# Patient Record
Sex: Female | Born: 1990 | Race: Black or African American | Hispanic: No | Marital: Single | Smoking: Former smoker
Health system: Southern US, Community
[De-identification: ages and names within clinical notes are randomized; demographics above are authoritative.]

## PROBLEM LIST (undated history)

## (undated) ENCOUNTER — Inpatient Hospital Stay (HOSPITAL_COMMUNITY): Payer: Self-pay

## (undated) DIAGNOSIS — J302 Other seasonal allergic rhinitis: Secondary | ICD-10-CM

## (undated) DIAGNOSIS — Z789 Other specified health status: Secondary | ICD-10-CM

## (undated) HISTORY — DX: Other specified health status: Z78.9

## (undated) HISTORY — PX: NO PAST SURGERIES: SHX2092

---

## 2011-02-08 ENCOUNTER — Inpatient Hospital Stay (INDEPENDENT_AMBULATORY_CARE_PROVIDER_SITE_OTHER)
Admission: RE | Admit: 2011-02-08 | Discharge: 2011-02-08 | Disposition: A | Discharge status: Home / Self Care | Payer: Self-pay | Source: Ambulatory Visit | Attending: Family Medicine | Admitting: Family Medicine

## 2011-02-08 DIAGNOSIS — S139XXA Sprain of joints and ligaments of unspecified parts of neck, initial encounter: Secondary | ICD-10-CM

## 2011-06-03 ENCOUNTER — Emergency Department (HOSPITAL_COMMUNITY)
Admission: EM | Admit: 2011-06-03 | Discharge: 2011-06-04 | Disposition: A | Discharge status: Home / Self Care | Payer: Self-pay | Attending: Emergency Medicine | Admitting: Emergency Medicine

## 2011-06-03 ENCOUNTER — Encounter: Payer: Self-pay | Admitting: *Deleted

## 2011-06-03 DIAGNOSIS — A499 Bacterial infection, unspecified: Secondary | ICD-10-CM | POA: Insufficient documentation

## 2011-06-03 DIAGNOSIS — B9689 Other specified bacterial agents as the cause of diseases classified elsewhere: Secondary | ICD-10-CM | POA: Insufficient documentation

## 2011-06-03 DIAGNOSIS — N760 Acute vaginitis: Secondary | ICD-10-CM | POA: Insufficient documentation

## 2011-06-03 HISTORY — DX: Other seasonal allergic rhinitis: J30.2

## 2011-06-03 NOTE — ED Provider Notes (Signed)
History     CSN: [id number] Arrival date & time: 06/03/2011 11:14 PM   First MD Initiated Contact with Patient 06/03/11 2316      Chief Complaint  Patient presents with  . Vaginal Discharge    (Consider location/radiation/quality/duration/timing/severity/associated sxs/prior treatment) HPI The patient is a 20 year old G1 P0 010 who presents today complaining of one month of whitish and clear vaginal discharge. Patient had unprotected sex with a new partner 2 months ago. She says is discharged in bothering her just over the past month. Last period was November 1. She do not suspect that she could be pregnant. She denies any abdominal pain, nausea, vomiting, fevers, or other symptoms. She denies any dysuria or increased urinary frequency. Patient does not have any history of STDs or bacterial vaginosis.  She does notice she has been trying to douche and that this seems to have made things worse. There are no other associated or modifying factors.  Past Medical History  Diagnosis Date  . Seasonal allergies     History reviewed. No pertinent past surgical history.  Family History  Problem Relation Age of Onset  . Hypertension Mother     History  Substance Use Topics  . Smoking status: Former Games developer  . Smokeless tobacco: Former Neurosurgeon    Quit date: 03/03/2011  . Alcohol Use: No    OB History    Grav Para Term Preterm Abortions TAB SAB Ect Mult Living                  Review of Systems  Constitutional: Negative.   HENT: Negative.   Eyes: Negative.   Respiratory: Negative.   Cardiovascular: Negative.   Gastrointestinal: Negative.   Genitourinary: Positive for vaginal discharge.  Musculoskeletal: Negative.   Skin: Negative.   Neurological: Negative.   Hematological: Negative.   Psychiatric/Behavioral: Negative.   All other systems reviewed and are negative.    Allergies  Advil  Home Medications   Current Outpatient Rx  Name Route Sig Dispense Refill  . THERA M  PLUS PO TABS Oral Take 1 tablet by mouth daily.      Marland Kitchen OVER THE COUNTER MEDICATION Oral Take 1 tablet by mouth daily as needed. For allergies "[facility] 4 hour allergy medicine"     . LORATADINE 10 MG PO TABS Oral Take 10 mg by mouth daily.        BP 114/70  Pulse 79  Temp(Src) 98 F (36.7 C) (Oral)  Resp 18  SpO2 98%  LMP 05/26/2011  Physical Exam  Nursing note and vitals reviewed. Constitutional: She is oriented to person, place, and time. She appears well-developed and well-nourished. No distress.  HENT:  Head: Normocephalic and atraumatic.  Eyes: Conjunctivae and EOM are normal. Pupils are equal, round, and reactive to light.  Neck: Normal range of motion.  Cardiovascular: Normal rate, regular rhythm, normal heart sounds and intact distal pulses.  Exam reveals no gallop and no friction rub.   No murmur heard. Pulmonary/Chest: Effort normal and breath sounds normal. No respiratory distress. She has no wheezes. She has no rales.  Abdominal: Soft. Bowel sounds are normal. She exhibits no distension. There is no tenderness. There is no rebound and no guarding.  Genitourinary: Uterus normal. Pelvic exam was performed with patient prone. Cervix exhibits discharge. Cervix exhibits no motion tenderness and no friability. Right adnexum displays no tenderness. Left adnexum displays no tenderness. No tenderness or bleeding around the vagina. Vaginal discharge found.  Small amount of thin whitish discharge noted in the vaginal vault  Musculoskeletal: Normal range of motion.  Neurological: She is alert and oriented to person, place, and time. No cranial nerve deficit. She exhibits normal muscle tone. Coordination normal.  Skin: Skin is warm and dry. No rash noted.  Psychiatric: She has a normal mood and affect.    ED Course  Procedures (including critical care time)  Labs Reviewed  URINALYSIS, ROUTINE W REFLEX MICROSCOPIC - Abnormal; Notable for the following:    Appearance  CLOUDY (*)    Bilirubin Urine SMALL (*)    Ketones, ur 15 (*)    All other components within normal limits  WET PREP, GENITAL - Abnormal; Notable for the following:    Clue Cells, Wet Prep FEW (*)    WBC, Wet Prep HPF POC FEW (*)    All other components within normal limits  POCT PREGNANCY, URINE  GC/CHLAMYDIA PROBE AMP, GENITAL   No results found.   1. Bacterial vaginosis       MDM  The patient was evaluated by myself. She was nontoxic in appearance and had complaint of vaginal discharge. Urinalysis and urine pregnancy were ordered. Patient will also have a pelvic exam with wet prep and gonorrhea and Chlamydia testing. She did have concern for possible sexual transmitted disease given recent objective defects within his partner. Pelvic exam was performed and was remarkable only for a small amount of discharge. Patient did not have presentation consistent with cervicitis or pelvic inflammatory disease. Patient did have wet prep with evidence of clue cells. She was given Flagyl 500 mg by mouth and will be discharged with a prescription for this for the next 7 days. Patient was discharged home in good condition.       Cyndra Numbers, MD 06/04/11 [phone]

## 2011-06-03 NOTE — ED Notes (Signed)
C/o vaginal d/c, onset ~ 1 month ago, also itching after douche ~ 1-2 wks ago. (Denies: uti  Sx, pain, nvd or fever).

## 2011-06-04 LAB — URINALYSIS, ROUTINE W REFLEX MICROSCOPIC
Glucose, UA: NEGATIVE mg/dL
Hgb urine dipstick: NEGATIVE
Specific Gravity, Urine: 1.028 (ref 1.005–1.030)
pH: 6 (ref 5.0–8.0)

## 2011-06-04 LAB — WET PREP, GENITAL
Trich, Wet Prep: NONE SEEN
Yeast Wet Prep HPF POC: NONE SEEN

## 2011-06-04 MED ORDER — METRONIDAZOLE 500 MG PO TABS: 500.0000 mg | ORAL_TABLET | Freq: Two times a day (BID) | ORAL | Status: AC

## 2011-06-04 MED ORDER — METRONIDAZOLE 500 MG PO TABS
500.0000 mg | ORAL_TABLET | Freq: Once | ORAL | Status: AC
  Administered 2011-06-04: 500 mg via ORAL
  Filled 2011-06-04: qty 1

## 2011-06-04 NOTE — ED Notes (Signed)
Pt states that she has had vaginal discharge for the past 2 months. She stands and she feels that her discharge runs out a fair amount of discharge with a foul smell. Pt denies new sexual partner. Pt alert and oriented.

## 2011-06-04 NOTE — ED Notes (Signed)
Pt told to follow up with pcp and return for new or worse syptoms. Pt told to update phone number for phone calls. Pt told to take all the rx medication.

## 2011-06-06 LAB — GC/CHLAMYDIA PROBE AMP, GENITAL: GC Probe Amp, Genital: NEGATIVE

## 2012-04-27 ENCOUNTER — Encounter (HOSPITAL_COMMUNITY): Payer: Self-pay | Admitting: Emergency Medicine

## 2012-04-27 ENCOUNTER — Emergency Department (INDEPENDENT_AMBULATORY_CARE_PROVIDER_SITE_OTHER)
Admission: EM | Admit: 2012-04-27 | Discharge: 2012-04-27 | Disposition: A | Discharge status: Home / Self Care | Payer: Self-pay | Source: Home / Self Care | Attending: Family Medicine | Admitting: Family Medicine

## 2012-04-27 DIAGNOSIS — N898 Other specified noninflammatory disorders of vagina: Secondary | ICD-10-CM

## 2012-04-27 DIAGNOSIS — N949 Unspecified condition associated with female genital organs and menstrual cycle: Secondary | ICD-10-CM

## 2012-04-27 DIAGNOSIS — Z3201 Encounter for pregnancy test, result positive: Secondary | ICD-10-CM

## 2012-04-27 DIAGNOSIS — O26899 Other specified pregnancy related conditions, unspecified trimester: Secondary | ICD-10-CM

## 2012-04-27 DIAGNOSIS — R102 Pelvic and perineal pain: Secondary | ICD-10-CM

## 2012-04-27 LAB — WET PREP, GENITAL: Yeast Wet Prep HPF POC: NONE SEEN

## 2012-04-27 LAB — POCT URINALYSIS DIP (DEVICE)
Bilirubin Urine: NEGATIVE
Hgb urine dipstick: NEGATIVE
Nitrite: NEGATIVE
Protein, ur: NEGATIVE mg/dL
pH: 7 (ref 5.0–8.0)

## 2012-04-27 NOTE — ED Notes (Signed)
Pt c/o lower abd pain and lower back pain x 1 month. Has had positive home pregnancy tests x 2. LMP July.   Also states she is having white vaginal discharge, no odor.

## 2012-04-27 NOTE — ED Provider Notes (Signed)
History     CSN: [id number]  Arrival date & time 04/27/12  1535   First MD Initiated Contact with Patient 04/27/12 1552      Chief Complaint  Patient presents with  . Back Pain  . Abdominal Pain    (Consider location/radiation/quality/duration/timing/severity/associated sxs/prior treatment) HPI Comments: 21 year old female in G2 P0 10 here complaining of low abdominal discomfort (described as intermittent cramping) and intermittently low back pain for one month. Symptoms associated with intermittent nausea and repulsion to some foods. Has had 2 positive urine pregnancy test at home. States her last menstrual period was July 9 it only lasted 4 days (patient usually menstruates for 7 days) Also concerned about a white thin vaginal discharge. Denies vaginal oddor, itchiness or burning. Denies prior history of sexually transmitted diseases. She has had bacterial vaginosis in the past. Denies pelvic pain here. Denies vaginal bleeding or spotting. No headaches or dizziness. Denies dysuria or hematuria. Denies fever or chills. States her nose has been congested and she has a history of seasonal allergies and takes Claritin intermittently for her symptoms. She is a former smoker that quit smoking over a year ago. She is happy about her positive urine pregnancy results.   Past Medical History  Diagnosis Date  . Seasonal allergies     History reviewed. No pertinent past surgical history.  Family History  Problem Relation Age of Onset  . Hypertension Mother     History  Substance Use Topics  . Smoking status: Former Games developer  . Smokeless tobacco: Former Neurosurgeon    Quit date: 03/03/2011  . Alcohol Use: No    OB History    Grav Para Term Preterm Abortions TAB SAB Ect Mult Living                  Review of Systems  Constitutional: Positive for appetite change. Negative for fever and chills.  HENT: Positive for congestion.   Respiratory: Negative for shortness of breath and wheezing.     Gastrointestinal: Positive for nausea and abdominal pain. Negative for vomiting and diarrhea.  Genitourinary: Negative for dysuria, flank pain and dyspareunia.  Musculoskeletal: Positive for back pain.  Neurological: Negative for dizziness, weakness, numbness and headaches.    Allergies  Ibuprofen  Home Medications   Current Outpatient Rx  Name Route Sig Dispense Refill  . LORATADINE 10 MG PO TABS Oral Take 10 mg by mouth daily.      Carma Leaven M PLUS PO TABS Oral Take 1 tablet by mouth daily.      Marland Kitchen OVER THE COUNTER MEDICATION Oral Take 1 tablet by mouth daily as needed. For allergies "[facility] 4 hour allergy medicine"       BP 115/59  Pulse 81  Temp 98.4 F (36.9 C) (Oral)  Resp 18  Ht 5' 2.5" (1.588 m)  Wt 164 lb (74.39 kg)  BMI 29.52 kg/m2  SpO2 100%  LMP 01/31/2012  Physical Exam  Nursing note and vitals reviewed. Constitutional: She is oriented to person, place, and time. She appears well-developed and well-nourished. No distress.  HENT:  Head: Normocephalic and atraumatic.  Mouth/Throat: Oropharynx is clear and moist. No oropharyngeal exudate.       Nasal congestion with mild swelling and erythema of nasal turbinates and clear rhinorrhea. Normal pharynx. No exudates.  Eyes: Conjunctivae normal are normal. Right eye exhibits no discharge. Left eye exhibits no discharge. No scleral icterus.  Neck: Neck supple.  Cardiovascular: Normal rate, regular rhythm and normal heart sounds.  Pulmonary/Chest: Effort normal and breath sounds normal. She has no wheezes. She has no rales.  Abdominal: Soft. Bowel sounds are normal. She exhibits no distension and no mass. There is no tenderness. There is no rebound and no guarding.       No costovertebral tenderness  Genitourinary:    There is no rash or lesion on the right labia. There is no rash or lesion on the left labia. Uterus is enlarged. Uterus is not tender. Cervix exhibits no motion tenderness, no discharge and no  friability. Right adnexum displays no mass, no tenderness and no fullness. Left adnexum displays no mass, no tenderness and no fullness. No erythema or bleeding around the vagina. Vaginal discharge found.    Musculoskeletal:       No pain with palpation over spine bone processes. No obvious scoliosis or lordosis. And Reported tenderness to palpation in impress increased  muscle tone of the bilateral lumbar paravertebral muscles. Negative straight leg test bilaterally.   Lymphadenopathy:    She has no cervical adenopathy.  Neurological: She is alert and oriented to person, place, and time.  Skin: No rash noted. She is not diaphoretic.    ED Course  Procedures (including critical care time)  Labs Reviewed  POCT URINALYSIS DIP (DEVICE) - Abnormal; Notable for the following:    Leukocytes, UA TRACE (*)  Biochemical Testing Only. Please order routine urinalysis from main lab if confirmatory testing is needed.   All other components within normal limits  POCT PREGNANCY, URINE - Abnormal; Notable for the following:    Preg Test, Ur POSITIVE (*)     All other components within normal limits  URINE CULTURE  GC/CHLAMYDIA PROBE AMP, GENITAL  WET PREP, GENITAL   No results found.   1. Pregnancy test-positive   2. Vaginal discharge in pregnancy   3. Pelvic pain in pregnancy       MDM  21 year old G2 P0 here complaining of intermittent pelvic cramping, low back pain and vaginal discharge. Positive urine pregnancy test and trace leukocyte esterase. No dysuria. Pending tests include wet prep, GC and Chlamydia and urine culture. We'll call patient about lab results and we'll call in any medication to her pharmacy for treatment if needed. Other than vaginal discharge normal pelvic examination with no vaginal bleeding and no tenderness to cervical motion or with palpation of the uterus. Adnexa impress not enlarged. Despite reassuring clinical exam patient was asked to go to [facility] hospital as  per protocol for pregnant women complaining of abdominal pain she would likely have an obstetric ultrasound. Patient was encouraged to start prenatal care.  Sharin Grave, MD 05/01/12 1125

## 2012-04-28 LAB — URINE CULTURE: Culture: NO GROWTH

## 2012-04-30 ENCOUNTER — Telehealth (HOSPITAL_COMMUNITY): Payer: Self-pay | Admitting: *Deleted

## 2012-04-30 MED ORDER — METRONIDAZOLE 500 MG PO TABS: 500.0000 mg | ORAL_TABLET | Freq: Two times a day (BID) | ORAL | Status: DC

## 2012-04-30 NOTE — Progress Notes (Signed)
Bacterial Vaginosis in pregnancy.  Medication (Flagyl) sent to preferred pharmacy for vaginal irritation and clue cells found on wet prep performed on 04/29/12.  Attempt to call pt 04/30/12 @9 :22pm unsuccessful, not able to leave voicemail.

## 2012-04-30 NOTE — ED Notes (Signed)
GC/Chlamydia neg., Wet prep: many clue cells.  Lab shown to Lannie Fields NP.  She e-prescribed Flagyl to the [facility] on [address].  She tried to call pt. but could not reach her. Call 1. I called later and message said VM is not set up.  Call 2. Vassie Moselle 04/30/2012

## 2012-05-01 ENCOUNTER — Telehealth (HOSPITAL_COMMUNITY): Payer: Self-pay | Admitting: *Deleted

## 2012-05-01 NOTE — ED Notes (Signed)
Pt. Called back on VM yesterday @ 2257.  I called pt. Pt. verified x 2 and given results. Pt. told she needs Flagyl and it was E[facility]prescribed to the [facility].  She said that is far away and prefers [facility]. I told her to go to [facility] and get it transferred.  She can change her preferred pharmacy the next time she is here. Pt. instructed to no alcohol while taking this medication.  Pt. voiced understanding.

## 2012-05-05 NOTE — Progress Notes (Signed)
Medical screening examination/treatment/procedure(s) were performed by resident physician or non-physician practitioner and as supervising physician I was immediately available for consultation/collaboration.   KINDL,JAMES DOUGLAS MD.  

## 2012-07-10 ENCOUNTER — Emergency Department (INDEPENDENT_AMBULATORY_CARE_PROVIDER_SITE_OTHER)
Admission: EM | Admit: 2012-07-10 | Discharge: 2012-07-10 | Disposition: A | Discharge status: Home / Self Care | Payer: Medicaid Other | Source: Home / Self Care | Attending: Emergency Medicine | Admitting: Emergency Medicine

## 2012-07-10 ENCOUNTER — Encounter (HOSPITAL_COMMUNITY): Payer: Self-pay | Admitting: Emergency Medicine

## 2012-07-10 DIAGNOSIS — O26899 Other specified pregnancy related conditions, unspecified trimester: Secondary | ICD-10-CM

## 2012-07-10 DIAGNOSIS — R040 Epistaxis: Secondary | ICD-10-CM

## 2012-07-10 LAB — POCT URINALYSIS DIP (DEVICE)
Bilirubin Urine: NEGATIVE
Glucose, UA: NEGATIVE mg/dL
Hgb urine dipstick: NEGATIVE
Ketones, ur: NEGATIVE mg/dL
Leukocytes, UA: NEGATIVE
Nitrite: NEGATIVE
Protein, ur: NEGATIVE mg/dL
Specific Gravity, Urine: 1.01 (ref 1.005–1.030)
Urobilinogen, UA: 0.2 mg/dL (ref 0.0–1.0)
pH: 7 (ref 5.0–8.0)

## 2012-07-10 NOTE — ED Provider Notes (Signed)
Chief Complaint  Patient presents with  . Abdominal Pain    History of Present Illness:    The patient is a 21 year old female who is 5 months pregnant. Her last menstrual period was July 9. This is her first pregnancy. The past 2 or 3 days she's had intermittent pains in her left upper quadrant and right lower quadrant. These come and go lasting for seconds at a time. They're sharp rated 4-5/10 in intensity. They seem to be worse with activity and better with rest. She denies any fever or chills. She has had some nausea and vomiting with pregnancy but nothing severe. She had some diarrhea about 4 days ago. She denies any urinary symptoms. She's had no vaginal discharge or itching. No vaginal bleeding. She had some bleeding from her right nostril a couple days ago and some bloody postnasal drip. She denies any nasal congestion or sore throat. She has not received prenatal care yet. She is taking a multivitamin.  Review of Systems:  Other than noted above, the patient denies any of the following symptoms: Constitutional:  No fever, chills, fatigue, weight loss or anorexia. Lungs:  No cough or shortness of breath. Heart:  No chest pain, palpitations, syncope or edema.  No cardiac history. Abdomen:  No nausea, vomiting, hematememesis, melena, diarrhea, or hematochezia. GU:  No dysuria, frequency, urgency, or hematuria. Gyn:  No vaginal discharge, itching, abnormal bleeding, dyspareunia, or pelvic pain.  PMFSH:  Past medical history, family history, social history, meds, and allergies were reviewed along with nurse's notes.  No prior abdominal surgeries, past history of GI problems, STDs or GYN problems.  No history of aspirin or NSAID use.  No excessive alcohol intake.  Physical Exam:   Vital signs:  BP 107/70  Pulse 86  Temp 99.5 F (37.5 C) (Oral)  Resp 18  SpO2 100% Gen:  Alert, oriented, in no distress. Lungs:  Breath sounds clear and equal bilaterally.  No wheezes, rales or  rhonchi. Heart:  Regular rhythm.  No gallops or murmers.   Abdomen:  She has a gravid uterus measuring 20 cm from pubis to fundus. Fetal heart sounds were heard just to the left of the midline at 160 beats per minute. There was no tenderness over the uterus. Abdomen was otherwise soft and nontender without any other masses or organomegaly. Bowel sounds are normally active. Skin:  Clear, warm and dry.  No rash.  Labs:   Results for orders placed during the hospital encounter of 07/10/12  POCT URINALYSIS DIP (DEVICE)      Component Value Range   Glucose, UA NEGATIVE  NEGATIVE mg/dL   Bilirubin Urine NEGATIVE  NEGATIVE   Ketones, ur NEGATIVE  NEGATIVE mg/dL   Specific Gravity, Urine 1.010  1.005 - 1.030   Hgb urine dipstick NEGATIVE  NEGATIVE   pH 7.0  5.0 - 8.0   Protein, ur NEGATIVE  NEGATIVE mg/dL   Urobilinogen, UA 0.2  0.0 - 1.0 mg/dL   Nitrite NEGATIVE  NEGATIVE   Leukocytes, UA NEGATIVE  NEGATIVE     Assessment:  The primary encounter diagnosis was Epistaxis. A diagnosis of Abdominal pain complicating pregnancy was also pertinent to this visit.  Her abdomen right now is completely benign. There is no evidence of Braxton Hicks contractions. The pain she is having maybe normal pregnancy pains. They don't seem to be or is severe very long lasting and there no other concerning symptoms. I suggested the pains became more frequent or more severe or any  other symptoms occurred, that she go to [facility] for further evaluation. She was encouraged to get into prenatal care as soon as possible.  Plan:   1.  The following meds were prescribed:   New Prescriptions   No medications on file   2.  The patient was instructed in symptomatic care and handouts were given. 3.  The patient was told to return if becoming worse in any way, if no better in 3 or 4 days, and given some red flag symptoms that would indicate earlier return.    Reuben Likes, MD 07/10/12 [phone]

## 2012-07-10 NOTE — ED Notes (Signed)
C/o abdominal pain for a few days now.  Patient is pregnant left upper quad and right lower quad.

## 2012-07-26 ENCOUNTER — Encounter: Payer: Self-pay | Admitting: Obstetrics and Gynecology

## 2012-08-13 ENCOUNTER — Ambulatory Visit (HOSPITAL_COMMUNITY)
Admission: RE | Admit: 2012-08-13 | Discharge: 2012-08-13 | Disposition: A | Discharge status: Home / Self Care | Payer: Self-pay | Source: Ambulatory Visit | Attending: Obstetrics & Gynecology | Admitting: Obstetrics & Gynecology

## 2012-08-13 ENCOUNTER — Ambulatory Visit (INDEPENDENT_AMBULATORY_CARE_PROVIDER_SITE_OTHER): Payer: Self-pay | Admitting: Obstetrics & Gynecology

## 2012-08-13 ENCOUNTER — Encounter: Payer: Self-pay | Admitting: Obstetrics & Gynecology

## 2012-08-13 VITALS — BP 119/73 | Temp 98.2°F | Wt 198.9 lb

## 2012-08-13 DIAGNOSIS — O99210 Obesity complicating pregnancy, unspecified trimester: Secondary | ICD-10-CM | POA: Insufficient documentation

## 2012-08-13 DIAGNOSIS — Z3480 Encounter for supervision of other normal pregnancy, unspecified trimester: Secondary | ICD-10-CM | POA: Insufficient documentation

## 2012-08-13 DIAGNOSIS — E669 Obesity, unspecified: Secondary | ICD-10-CM | POA: Insufficient documentation

## 2012-08-13 DIAGNOSIS — O0990 Supervision of high risk pregnancy, unspecified, unspecified trimester: Secondary | ICD-10-CM

## 2012-08-13 DIAGNOSIS — O0930 Supervision of pregnancy with insufficient antenatal care, unspecified trimester: Secondary | ICD-10-CM

## 2012-08-13 LAB — POCT URINALYSIS DIP (DEVICE)
Ketones, ur: NEGATIVE mg/dL
Protein, ur: NEGATIVE mg/dL
Specific Gravity, Urine: 1.02 (ref 1.005–1.030)
pH: 7 (ref 5.0–8.0)

## 2012-08-13 LAB — HIV ANTIBODY (ROUTINE TESTING W REFLEX): HIV: NONREACTIVE

## 2012-08-13 MED ORDER — PRENATAL VITAMINS 0.8 MG PO TABS: 1.0000 | ORAL_TABLET | Freq: Every day | ORAL | Status: DC

## 2012-08-13 MED ORDER — INFLUENZA VIRUS VACC SPLIT PF IM SUSP
0.5000 mL | Freq: Once | INTRAMUSCULAR | Status: AC
  Administered 2012-08-13: 0.5 mL via INTRAMUSCULAR

## 2012-08-13 NOTE — Progress Notes (Signed)
   Subjective:    Whitney Yang is a G1P0 [redacted]w[redacted]d being seen today for her first obstetrical visit.  Her obstetrical history is significant for obesity and late prenatal care. Patient does intend to breast feed. Pregnancy history fully reviewed.  Patient reports no contractions and no leaking.  Filed Vitals:   08/13/12 0817  BP: 119/73  Temp: 98.2 F (36.8 C)  Weight: 198 lb 14.4 oz (90.22 kg)    HISTORY: OB History    Grav Para Term Preterm Abortions TAB SAB Ect Mult Living   1              # Outc Date GA Lbr Len/2nd Wgt Sex Del Anes PTL Lv   1 CUR              Past Medical History  Diagnosis Date  . Seasonal allergies   . No pertinent past medical history    Past Surgical History  Procedure Date  . No past surgeries    Family History  Problem Relation Age of Onset  . Hypertension Mother   . Hypertension Sister      Exam    Uterus:     Pelvic Exam:    Perineum: No Hemorrhoids, Normal Perineum   Vulva: normal   Vagina:  normal mucosa, normal discharge   pH:    Cervix: no lesions   Adnexa: no mass, fullness, tenderness   Bony Pelvis: average  System: Breast:  normal appearance, no masses or tenderness   Skin: normal coloration and turgor, no rashes    Neurologic: oriented, normal mood   Extremities: normal strength, tone, and muscle mass   HEENT PERRLA, neck supple with midline trachea and thyroid without masses   Mouth/Teeth mucous membranes moist, pharynx normal without lesions and dental hygiene good   Neck supple   Cardiovascular: regular rate and rhythm   Respiratory:  appears well, vitals normal, no respiratory distress, acyanotic, normal RR, ear and throat exam is normal, neck free of mass or lymphadenopathy, chest clear, no wheezing, crepitations, rhonchi, normal symmetric air entry   Abdomen: soft, non-tender; bowel sounds normal; no masses,  no organomegaly   Urinary: urethral meatus normal      Assessment:    Pregnancy: G1P0 Patient  Active Problem List  Diagnosis  . Supervision of normal first pregnancy  . Obesity in pregnancy  . Late prenatal care        Plan:     Initial labs drawn. Prenatal vitamins. Problem list reviewed and updated. Genetic Screening too late  Ultrasound discussed; fetal survey: requested.  Follow up in 2 weeks. 50% of 30 min visit spent on counseling and coordination of care.  Routine care   Immanuel Fedak 08/13/2012

## 2012-08-13 NOTE — Progress Notes (Signed)
p=[phone] 

## 2012-08-13 NOTE — Patient Instructions (Signed)
Prenatal Care   WHAT IS PRENATAL CARE?   Prenatal care means health care during your pregnancy, before your baby is born. Take care of yourself and your baby by:   · Getting early prenatal care. If you know you are pregnant, or think you might be pregnant, call your caregiver as soon as possible. Schedule a visit for a general/prenatal examination.  · Getting regular prenatal care. Follow your caregiver's schedule for blood and other necessary tests. Do not miss appointments.  · Do everything you can to keep yourself and your baby healthy during your pregnancy.  · Prenatal care should include evaluation of medical, dietary, educational, psychological, and social needs for the couple and the medical, surgical, and genetic history of the family of the mother and father.  · Discuss with your caregiver:  · Your medicines, prescription, over-the-counter, and herbal medicines.  · Substance abuse, alcohol, smoking, and illegal drugs.  · Domestic abuse and violence, if present.  · Your immunizations.  · Nutrition and diet.  · Exercising.  · Environment and occupational hazards, at home and at work.  · History of sexually transmitted disease, both you and your partner.  · Previous pregnancies.  WHY IS PRENATAL CARE SO IMPORTANT?   By seeing you regularly, your caregiver has the chance to find problems early, so that they can be treated as soon as possible. Other problems might be prevented. Many studies have shown that early and regular prenatal care is important for the health of both mothers and their babies.   I AM THINKING ABOUT GETTING PREGNANT. HOW CAN I TAKE CARE OF MYSELF?   Taking care of yourself before you get pregnant helps you to have a healthy pregnancy. It also lowers your chances of having a baby born with a birth defect. Here are ways to take care of yourself before you get pregnant:   · Eat healthy foods, exercise regularly (30 minutes per day for most days of the week is best), and get enough rest and  sleep. Talk to your caregiver about what kinds of foods and exercises are best for you.  · Take 400 micrograms (mcg) of folic acid (one of the B vitamins) every day. The best way to do this is to take a daily multivitamin pill that contains this amount of folic acid. Getting enough of the synthetic (manufactured) form of folic acid every day before you get pregnant and during early pregnancy can help prevent certain birth defects. Many breakfast cereals and other grain products have folic acid added to them, but only certain cereals contain 400 mcg of folic acid per serving. Check the label on your multivitamin or cereal to find the amount of folic acid in the food.  · See your caregiver for a complete check up before getting pregnant. Make sure that you have had all your immunization shots, especially for rubella (German measles). Rubella can cause serious birth defects. Chickenpox is another illness you want to avoid during pregnancy. If you have had chickenpox and rubella in the past, you should be immune to them.  · Tell your caregiver about any prescription or non-prescription medicines (including herbal remedies) you are taking. Some medicines are not safe to take during pregnancy.  · Stop smoking cigarettes, drinking alcohol, or taking illegal drugs. Ask your caregiver for help, if you need it. You can also get help with alcohol and drugs by talking with a member of your faith community, a counselor, or a trusted friend.  · Discuss   and treat any medical, social, or psychological problems before getting pregnant.  · Discuss any history of genetic problems in the mother, father, and their families. Do genetic testing before getting pregnant, when possible.  · Discuss any physical or emotional abuse with your caregiver.  · Discuss with your caregiver if you might be exposed to harmful chemicals on your job or where you live.  · Discuss with your caregiver if you think your job or the hours you work may be  harmful and should be changed.  · The father should be involved with the decision making and with all aspects of the pregnancy, labor, and delivery.  · If you have medical insurance, make sure you are covered for pregnancy.  I JUST FOUND OUT THAT I AM PREGNANT. HOW CAN I TAKE CARE OF MYSELF?   Here are ways to take care of yourself and the precious new life growing inside you:   · Continue taking your multivitamin with 400 micrograms (mcg) of folic acid every day.  · Get early and regular prenatal care. It does not matter if this is your first pregnancy or if you already have children. It is very important to see a caregiver during your pregnancy. Your caregiver will check at each visit to make sure that you and the baby are healthy. If there are any problems, action can be taken right away to help you and the baby.  · Eat a healthy diet that includes:  · Fruits.  · Vegetables.  · Foods low in saturated fat.  · Grains.  · Calcium-rich foods.  · Drink 6 to 8 glasses of liquids a day.  · Unless your caregiver tells you not to, try to be physically active for 30 minutes, most days of the week. If you are pressed for time, you can get your activity in through 10 minute segments, three times a day.  · If you smoke, drink alcohol, or use drugs, STOP. These can cause long-term damage to your baby. Talk with your caregiver about steps to take to stop smoking. Talk with a member of your faith community, a counselor, a trusted friend, or your caregiver if you are concerned about your alcohol or drug use.  · Ask your caregiver before taking any medicine, even over-the-counter medicines. Some medicines are not safe to take during pregnancy.  · Get plenty of rest and sleep.  · Avoid hot tubs and saunas during pregnancy.  · Do not have X-rays taken, unless absolutely necessary and with the recommendation of your caregiver. A lead shield can be placed on your abdomen, to protect the baby when X-rays are taken in other parts of the  body.  · Do not empty the cat litter when you are pregnant. It may contain a parasite that causes an infection called toxoplasmosis, which can cause birth defects. Also, use gloves when working in garden areas used by cats.  · Do not eat uncooked or undercooked cheese, meats, or fish.  · Stay away from toxic chemicals like:  · Insecticides.  · Solvents (some cleaners or paint thinners).  · Lead.  · Mercury.  · Sexual relations may continue until the end of the pregnancy, unless you have a medical problem or there is a problem with the pregnancy and your caregiver tells you not to.  · Do not wear high heel shoes, especially during the second half of the pregnancy. You can lose your balance and fall.  · Do not take long trips, unless   absolutely necessary. Be sure to see your caregiver before going on the trip.  · Do not sit in one position for more than 2 hours, when on a trip.  · Take a copy of your medical records when going on a trip.  · Know where there is a hospital in the city you are visiting, in case of an emergency.  · Most dangerous household products will have pregnancy warnings on their labels. Ask your caregiver about products if you are unsure.  · Limit or eliminate your caffeine intake from coffee, tea, sodas, medicines, and chocolate.  · Many women continue working through pregnancy. Staying active might help you stay healthier. If you have a question about the safety or the hours you work at your particular job, talk with your caregiver.  · Get informed:  · Read books.  · Watch videos.  · Go to childbirth classes for you and the father.  · Talk with experienced moms.  · Ask your caregiver about childbirth education classes for you and your partner. Classes can help you and your partner prepare for the birth of your baby.  · Ask about a pediatrician (baby doctor) and methods and pain medicine for labor, delivery, and possible Cesarean delivery (C-section).  I AM NOT THINKING ABOUT GETTING PREGNANT  RIGHT NOW, BUT HEARD THAT ALL WOMEN SHOULD TAKE FOLIC ACID EVERY DAY?   All women of childbearing age, with even a remote chance of getting pregnant, should try to make sure they get enough folic acid. Many pregnancies are not planned. Many women do not know they are actually pregnant early in their pregnancies, and certain birth defects happen in the very early part of pregnancy. Taking 400 micrograms (mcg) of folic acid every day will help prevent certain birth defects that happen in the early part of pregnancy. If a woman begins taking vitamin pills in the second or third month of pregnancy, it may be too late to prevent birth defects. Folic acid may also have other health benefits for women, besides preventing birth defects.   HOW OFTEN SHOULD I SEE MY CAREGIVER DURING PREGNANCY?   Your caregiver will give you a schedule for your prenatal visits. You will have visits more often as you get closer to the end of your pregnancy. An average pregnancy lasts about 40 weeks.   A typical schedule includes visiting your caregiver:   · About once each month, during your first 6 months of pregnancy.  · Every 2 weeks, during the next 2 months.  · Weekly in the last month, until the delivery date.  Your caregiver will probably want to see you more often if:  · You are over 35.  · Your pregnancy is high risk, because you have certain health problems or problems with the pregnancy, such as:  · Diabetes.  · High blood pressure.  · The baby is not growing on schedule, according to the dates of the pregnancy.  Your caregiver will do special tests, to make sure you and the baby are not having any serious problems.  WHAT HAPPENS DURING PRENATAL VISITS?   · At your first prenatal visit, your caregiver will talk to you about you and your partner's health history and your family's health history, and will do a physical exam.  · On your first visit, a physical exam will include checks of your blood pressure, height and weight, and an  exam of your pelvic organs. Your caregiver will do a Pap test if you have   not had one recently, and will do cultures of your cervix to make sure there is no infection.  · At each visit, there will be tests of your blood, urine, blood pressure, weight, and checking the progress of the baby.  · Your caregiver will be able to tell you when to expect that your baby will be born.  · Each visit is also a chance for you to learn about staying healthy during pregnancy and for asking questions.  · Discuss whether you will be breastfeeding.  · At your later prenatal visits, your caregiver will check how you are doing and how the baby is developing. You may have a number of tests done as your pregnancy progresses.  · Ultrasound exams are often used to check on the baby's growth and health.  · You may have more urine and blood tests, as well as special tests, if needed. These may include amniocentesis (examine fluid in the pregnancy sac), stress tests (check how baby responds to contractions), biophysical profile (measures fetus well-being). Your caregiver will explain the tests and why they are necessary.  I AM IN MY LATE THIRTIES, AND I WANT TO HAVE A CHILD NOW. SHOULD I DO ANYTHING SPECIAL?   As you get older, there is more chance of having a medical problem (high blood pressure), pregnancy problem (preeclampsia, problems with the placenta), miscarriage, or a baby born with a birth defect. However, most women in their late thirties and early forties have healthy babies. See your caregiver on a regular basis before you get pregnant and be sure to go for exams throughout your pregnancy. Your caregiver probably will want to do some special tests to check on you and your baby's health when you are pregnant.   Women today are often delaying having children until later in life, when they are in their thirties and forties. While many women in their thirties and forties have no difficulty getting pregnant, fertility does decline  with age. For women over 40 who cannot get pregnant after 6 months of trying, it is recommended that they see their caregiver for a fertility evaluation. It is not uncommon to have trouble becoming pregnant or experience infertility (inability to become pregnant after trying for one year). If you think that you or your partner may be infertile, you can discuss this with your caregiver. He or she can recommend treatments such as drugs, surgery, or assisted reproductive technology.   Document Released: 07/14/2003 Document Revised: 10/03/2011 Document Reviewed: 06/10/2009  ExitCare® Patient Information ©2013 [employer].

## 2012-08-14 LAB — OBSTETRIC PANEL
Eosinophils Absolute: 0.3 10*3/uL (ref 0.0–0.7)
Eosinophils Relative: 5 % (ref 0–5)
Hemoglobin: 10.4 g/dL — ABNORMAL LOW (ref 12.0–15.0)
Lymphs Abs: 1.2 10*3/uL (ref 0.7–4.0)
MCH: 29.6 pg (ref 26.0–34.0)
MCV: 86.9 fL (ref 78.0–100.0)
Monocytes Absolute: 0.4 10*3/uL (ref 0.1–1.0)
Monocytes Relative: 7 % (ref 3–12)
RBC: 3.51 MIL/uL — ABNORMAL LOW (ref 3.87–5.11)
Rh Type: POSITIVE

## 2012-08-15 LAB — HEMOGLOBINOPATHY EVALUATION: Hgb A: 96.8 % (ref 96.8–97.8)

## 2012-08-27 ENCOUNTER — Encounter (HOSPITAL_COMMUNITY): Payer: Self-pay | Admitting: Emergency Medicine

## 2012-08-27 ENCOUNTER — Encounter: Payer: Self-pay | Admitting: Obstetrics & Gynecology

## 2012-08-27 ENCOUNTER — Emergency Department (HOSPITAL_COMMUNITY)
Admission: EM | Admit: 2012-08-27 | Discharge: 2012-08-27 | Disposition: A | Discharge status: Home / Self Care | Payer: Self-pay | Attending: Emergency Medicine | Admitting: Emergency Medicine

## 2012-08-27 DIAGNOSIS — W19XXXA Unspecified fall, initial encounter: Secondary | ICD-10-CM

## 2012-08-27 DIAGNOSIS — Y939 Activity, unspecified: Secondary | ICD-10-CM | POA: Insufficient documentation

## 2012-08-27 DIAGNOSIS — Z79899 Other long term (current) drug therapy: Secondary | ICD-10-CM | POA: Insufficient documentation

## 2012-08-27 DIAGNOSIS — Y92009 Unspecified place in unspecified non-institutional (private) residence as the place of occurrence of the external cause: Secondary | ICD-10-CM | POA: Insufficient documentation

## 2012-08-27 DIAGNOSIS — W010XXA Fall on same level from slipping, tripping and stumbling without subsequent striking against object, initial encounter: Secondary | ICD-10-CM | POA: Insufficient documentation

## 2012-08-27 DIAGNOSIS — Z87891 Personal history of nicotine dependence: Secondary | ICD-10-CM | POA: Insufficient documentation

## 2012-08-27 DIAGNOSIS — S301XXA Contusion of abdominal wall, initial encounter: Secondary | ICD-10-CM | POA: Insufficient documentation

## 2012-08-27 DIAGNOSIS — Z8709 Personal history of other diseases of the respiratory system: Secondary | ICD-10-CM | POA: Insufficient documentation

## 2012-08-27 DIAGNOSIS — Z3490 Encounter for supervision of normal pregnancy, unspecified, unspecified trimester: Secondary | ICD-10-CM

## 2012-08-27 DIAGNOSIS — O9989 Other specified diseases and conditions complicating pregnancy, childbirth and the puerperium: Secondary | ICD-10-CM | POA: Insufficient documentation

## 2012-08-27 NOTE — ED Notes (Addendum)
Ambulatory with PTAR to Trauma B.  Pt states she tripped over flip flops that she was wearing and fell in her house.  Pt reports pain to R side from hitting a coffee table.  Denies LOC. Denies neck or back pain.  Reports fetal movement since fall.  Pt states she has urinated since fall.  Denies any vaginal discharge or bleeding.  Abrasion noted to right lateral back/side.  Consulting civil engineer notifying Rapid [facility] at [facility]

## 2012-08-27 NOTE — ED Notes (Signed)
OB RRT RN at bedside

## 2012-08-27 NOTE — ED Notes (Addendum)
Pt alert, NAD, calm, interactive, resps e/u, speaking in clear complete sentences. EDP at [facility]. Report received from [facility], RN.

## 2012-08-27 NOTE — Progress Notes (Signed)
Notified Dr Penne Lash of pt @ [place], after tripping and falling at home (at approx 0100).  FHT 145, reactive, 10x10 accels, no decels.  One contraction after 20 minute tracing.  Pt states she feels no contractions.  No leaking fluid or bleeding.  Will monitor for 6 hours post-fall (0700), per Dr Penne Lash.

## 2012-08-27 NOTE — ED Provider Notes (Signed)
History     CSN: [id number]  Arrival date & time 08/27/12  0134   First MD Initiated Contact with Patient 08/27/12 [phone]      Chief Complaint  Patient presents with  . Fall    6 month pregnant    (Consider location/radiation/quality/duration/timing/severity/associated sxs/prior treatment) Patient is a 22 y.o. female presenting with fall. The history is provided by the patient.  Fall  She is pregnant with [facility] of April 15 which was red almost 30 weeks. She tripped and fell and struck the right side of her abdomen against an end table. Pain is moderate and she rates it at 6/10. She has felt fetal movement since the fall. There's been no bleeding. Pregnancy has been uneventful. She is G1, P0, A0.  Past Medical History  Diagnosis Date  . Seasonal allergies   . No pertinent past medical history     Past Surgical History  Procedure Date  . No past surgeries     Family History  Problem Relation Age of Onset  . Hypertension Mother   . Hypertension Sister     History  Substance Use Topics  . Smoking status: Former Games developer  . Smokeless tobacco: Former Neurosurgeon    Quit date: 03/03/2011  . Alcohol Use: No    OB History    Grav Para Term Preterm Abortions TAB SAB Ect Mult Living   1               Review of Systems  All other systems reviewed and are negative.    Allergies  Ibuprofen  Home Medications   Current Outpatient Rx  Name  Route  Sig  Dispense  Refill  . LORATADINE 10 MG PO TABS   Oral   Take 10 mg by mouth daily.           Marland Kitchen PRENATAL VITAMINS 0.8 MG PO TABS   Oral   Take 1 tablet by mouth daily.   30 tablet   12     LMP 01/31/2012  Physical Exam  Nursing note and vitals reviewed.  22 year old female, resting comfortably and in no acute distress. Vital signs are significant for tachycardia with heart rate of 112, but this may be normal for this stage of pregnancy. Also, vital signs are significant for hypertension with blood pressure 142/65.  Oxygen saturation is 100%, which is normal. Head is normocephalic and atraumatic. PERRLA, EOMI. Oropharynx is clear. Neck is nontender and supple without adenopathy or JVD. Back is nontender and there is no CVA tenderness. Lungs are clear without rales, wheezes, or rhonchi. Chest is nontender. Heart has regular rate and rhythm without murmur. Abdomen is soft, flat, nontender without hepatosplenomegaly and peristalsis is normoactive. Uterine fundus is soft and nontender and above the umbilicus consistent with dates. There is a small umbilical hernia present which is easily reducible. Extremities have no cyanosis or edema, full range of motion is present. Skin is warm and dry without rash. Neurologic: Mental status is normal, cranial nerves are intact, there are no motor or sensory deficits.  ED Course  Procedures (including critical care time)   1. Fall   2. Contusion, abdominal wall   3. Pregnancy       MDM  Fall with mild trauma to the abdomen. At this time, no evidence of fetal injury but will be consult will be obtained for nonstress test.  OB rapid response nurse has come to the ED and monitored the patient. She's not had any contractions  there any fetal heart decelerations. The on call obstetrician has stated that if she has no problem 6 hours after her fall that she can be discharged. That time has been reached, and she is discharged to followup with her obstetrical clinic.    Dione Booze, MD 08/27/12 [phone]

## 2012-08-27 NOTE — ED Notes (Signed)
Rapid Response OB Nurse at bedside.  

## 2012-08-27 NOTE — Progress Notes (Signed)
Pt has sleep w/o complaint since 0315.  FHT 140bpm, reactive w/15x15 accels and no decels.  No contractions seen on tracing.  6-hr post-fall monitoring complete at 0700, will d/c' monitoring then for discharge home, if no change in status.

## 2012-09-03 ENCOUNTER — Encounter: Payer: Self-pay | Admitting: Obstetrics & Gynecology

## 2012-09-10 ENCOUNTER — Ambulatory Visit (INDEPENDENT_AMBULATORY_CARE_PROVIDER_SITE_OTHER): Payer: Medicaid Other | Admitting: Obstetrics & Gynecology

## 2012-09-10 ENCOUNTER — Other Ambulatory Visit: Payer: Self-pay | Admitting: Obstetrics & Gynecology

## 2012-09-10 VITALS — BP 123/70 | Wt 200.4 lb

## 2012-09-10 DIAGNOSIS — O0930 Supervision of pregnancy with insufficient antenatal care, unspecified trimester: Secondary | ICD-10-CM

## 2012-09-10 LAB — CBC
MCH: 28.3 pg (ref 26.0–34.0)
Platelets: 289 10*3/uL (ref 150–400)
RBC: 3.64 MIL/uL — ABNORMAL LOW (ref 3.87–5.11)
RDW: 13.3 % (ref 11.5–15.5)
WBC: 6.6 10*3/uL (ref 4.0–10.5)

## 2012-09-10 LAB — RPR

## 2012-09-10 MED ORDER — TETANUS-DIPHTH-ACELL PERTUSSIS 5-2.5-18.5 LF-MCG/0.5 IM SUSP: 0.5000 mL | Freq: Once | INTRAMUSCULAR | Status: DC

## 2012-09-10 NOTE — Progress Notes (Signed)
Pulse: 94 Pt is requesting an ultrasound.  1hr gtt today.

## 2012-09-10 NOTE — Progress Notes (Signed)
Reports having a fall on 08/27/12.  No bleeding, contractions or LOF.  Good FM.  Bedside ultrasound done, patient reassured.  No other complaints or concerns.  Fetal movement and labor precautions reviewed.

## 2012-09-10 NOTE — Patient Instructions (Signed)
Return to clinic for any obstetric concerns or go to [facility] for evaluation  

## 2012-09-11 ENCOUNTER — Encounter: Payer: Self-pay | Admitting: Obstetrics & Gynecology

## 2012-09-11 LAB — POCT URINALYSIS DIP (DEVICE)
Bilirubin Urine: NEGATIVE
Nitrite: NEGATIVE
Urobilinogen, UA: 1 mg/dL (ref 0.0–1.0)
pH: 6 (ref 5.0–8.0)

## 2012-09-24 ENCOUNTER — Ambulatory Visit (INDEPENDENT_AMBULATORY_CARE_PROVIDER_SITE_OTHER): Payer: Self-pay | Admitting: Advanced Practice Midwife

## 2012-09-24 VITALS — BP 125/69 | Temp 97.8°F | Wt 203.0 lb

## 2012-09-24 DIAGNOSIS — O0930 Supervision of pregnancy with insufficient antenatal care, unspecified trimester: Secondary | ICD-10-CM

## 2012-09-24 DIAGNOSIS — Z3493 Encounter for supervision of normal pregnancy, unspecified, third trimester: Secondary | ICD-10-CM

## 2012-09-24 DIAGNOSIS — Z3480 Encounter for supervision of other normal pregnancy, unspecified trimester: Secondary | ICD-10-CM

## 2012-09-24 DIAGNOSIS — R040 Epistaxis: Secondary | ICD-10-CM

## 2012-09-24 LAB — POCT URINALYSIS DIP (DEVICE)
Ketones, ur: NEGATIVE mg/dL
Leukocytes, UA: NEGATIVE
Nitrite: NEGATIVE
Protein, ur: NEGATIVE mg/dL
Urobilinogen, UA: 0.2 mg/dL (ref 0.0–1.0)

## 2012-09-24 NOTE — Progress Notes (Signed)
Pulse-  95 

## 2012-09-24 NOTE — Patient Instructions (Signed)
Pregnancy - Third Trimester The third trimester begins at the 28th week of pregnancy and ends at birth. It is important to follow your doctor's instructions. HOME CARE   Go to your doctor's visits.  Do not smoke.  Do not drink alcohol or use drugs.  Only take medicine as told by your doctor.  Take prenatal vitamins as told. The vitamin should contain 1 milligram of folic acid.  Exercise.  Eat healthy foods. Eat regular, well-balanced meals.  You can have sex (intercourse) if there are no other problems with the pregnancy.  Do not use hot tubs, steam rooms, or saunas.  Wear a seat belt while driving.  Avoid raw meat, uncooked cheese, and litter boxes and soil used by cats.  Rest with your legs raised (elevated).  Make a list of emergency phone numbers. Keep this list with you.  Arrange for help when you come back home after delivering the baby.  Make a trial run to the hospital.  Take prenatal classes.  Prepare the baby's nursery.  Do not travel out of the city. If you absolutely have to, get permission from your doctor first.  Wear flat shoes. Do not wear high heels. GET HELP RIGHT AWAY IF:   You have a temperature by mouth above 102 F (38.9 C), not controlled by medicine.  You have not felt the baby move for more than 1 hour. If you think the baby is not moving as much as normal, eat something with sugar in it or lie down on your left side for an hour. The baby should move at least 4 to 5 times per hour.  Fluid is coming from the vagina.  Blood is coming from the vagina. Light spotting is common, especially after sex (intercourse).  You have belly (abdominal) pain.  You have a bad smelling fluid (discharge) coming from the vagina. The fluid changes from clear to white.  You still feel sick to your stomach (nauseous).  You throw up (vomit) for more than 24 hours.  You have the chills.  You have shortness of breath.  You have a burning feeling when you  pee (urinate).  You lose or gain more than 2 pounds (0.9 kilograms) of weight over a week, or as told by your doctor.  Your face, hands, feet, or legs get puffy (swell).  You have a bad headache that will not go away.  You start to have problems seeing (blurry or double vision).  You fall, are in a car accident, or have any kind of trauma.  There is mental or physical violence at home.  You have any concerns or worries during your pregnancy. MAKE SURE YOU:   Understand these instructions.  Will watch your condition.  Will get help right away if you are not doing well or get worse. Document Released: 10/05/2009 Document Revised: 10/03/2011 Document Reviewed: 10/05/2009 ExitCare Patient Information 2013 [employer].  

## 2012-09-24 NOTE — Progress Notes (Signed)
Well, no c/o, rev'd precautions.

## 2012-10-10 ENCOUNTER — Ambulatory Visit (INDEPENDENT_AMBULATORY_CARE_PROVIDER_SITE_OTHER): Payer: Self-pay | Admitting: Advanced Practice Midwife

## 2012-10-10 VITALS — BP 130/63 | Temp 99.3°F | Wt 209.6 lb

## 2012-10-10 DIAGNOSIS — O99210 Obesity complicating pregnancy, unspecified trimester: Secondary | ICD-10-CM

## 2012-10-10 DIAGNOSIS — O0933 Supervision of pregnancy with insufficient antenatal care, third trimester: Secondary | ICD-10-CM

## 2012-10-10 DIAGNOSIS — E669 Obesity, unspecified: Secondary | ICD-10-CM

## 2012-10-10 LAB — POCT URINALYSIS DIP (DEVICE)
Glucose, UA: NEGATIVE mg/dL
Nitrite: NEGATIVE
Protein, ur: NEGATIVE mg/dL
Specific Gravity, Urine: 1.025 (ref 1.005–1.030)
Urobilinogen, UA: 0.2 mg/dL (ref 0.0–1.0)

## 2012-10-10 NOTE — Progress Notes (Signed)
Pulse- 94   Pressure/pain-lower abd

## 2012-10-11 LAB — GC/CHLAMYDIA PROBE AMP: CT Probe RNA: NEGATIVE

## 2012-10-11 NOTE — Progress Notes (Signed)
GBS and cultures done.

## 2012-10-13 LAB — CULTURE, BETA STREP (GROUP B ONLY)

## 2012-10-17 ENCOUNTER — Ambulatory Visit (INDEPENDENT_AMBULATORY_CARE_PROVIDER_SITE_OTHER): Payer: Medicaid Other | Admitting: Advanced Practice Midwife

## 2012-10-17 ENCOUNTER — Ambulatory Visit (HOSPITAL_COMMUNITY)
Admission: RE | Admit: 2012-10-17 | Discharge: 2012-10-17 | Disposition: A | Discharge status: Home / Self Care | Payer: Medicaid Other | Source: Ambulatory Visit | Attending: Advanced Practice Midwife | Admitting: Advanced Practice Midwife

## 2012-10-17 ENCOUNTER — Encounter: Payer: Self-pay | Admitting: Obstetrics & Gynecology

## 2012-10-17 ENCOUNTER — Encounter: Payer: Self-pay | Admitting: Advanced Practice Midwife

## 2012-10-17 VITALS — BP 107/64 | Temp 98.3°F | Wt 210.0 lb

## 2012-10-17 DIAGNOSIS — Z3689 Encounter for other specified antenatal screening: Secondary | ICD-10-CM | POA: Insufficient documentation

## 2012-10-17 DIAGNOSIS — O99210 Obesity complicating pregnancy, unspecified trimester: Secondary | ICD-10-CM

## 2012-10-17 DIAGNOSIS — O365931 Maternal care for other known or suspected poor fetal growth, third trimester, fetus 1: Secondary | ICD-10-CM

## 2012-10-17 DIAGNOSIS — O0930 Supervision of pregnancy with insufficient antenatal care, unspecified trimester: Secondary | ICD-10-CM

## 2012-10-17 DIAGNOSIS — R040 Epistaxis: Secondary | ICD-10-CM

## 2012-10-17 DIAGNOSIS — E669 Obesity, unspecified: Secondary | ICD-10-CM

## 2012-10-17 DIAGNOSIS — Z3480 Encounter for supervision of other normal pregnancy, unspecified trimester: Secondary | ICD-10-CM

## 2012-10-17 DIAGNOSIS — O365990 Maternal care for other known or suspected poor fetal growth, unspecified trimester, not applicable or unspecified: Secondary | ICD-10-CM

## 2012-10-17 LAB — POCT URINALYSIS DIP (DEVICE)
Hgb urine dipstick: NEGATIVE
Nitrite: NEGATIVE
Protein, ur: 30 mg/dL — AB
Urobilinogen, UA: 0.2 mg/dL (ref 0.0–1.0)
pH: 7 (ref 5.0–8.0)

## 2012-10-17 NOTE — Progress Notes (Signed)
Doing well.  Good fetal movement, denies vaginal bleeding, LOF, regular contractions.  Reports irregular braxton-hicks every few days.  Anatomy scan date 4 weeks behind LMP date.  Size< dates by LMP date today, congruent with U/S date.  Outpatient U/S ordered to review interval growth, consider adjusting EDC.  Addendum: EDC changed to 5/12 after U/S results today, appropriate interval growth and size=dates for [redacted]w[redacted]d, matching previous U/S.  Pt notified by phone.

## 2012-10-17 NOTE — Progress Notes (Signed)
Ob US growth today @ 1330

## 2012-10-17 NOTE — Progress Notes (Signed)
Pulse  95 C/o pain and pressure in pelvic. C/o of muscle cramps on arms and legs.

## 2012-10-22 ENCOUNTER — Other Ambulatory Visit: Payer: Self-pay

## 2012-10-22 DIAGNOSIS — O0930 Supervision of pregnancy with insufficient antenatal care, unspecified trimester: Secondary | ICD-10-CM

## 2012-10-22 DIAGNOSIS — O0990 Supervision of high risk pregnancy, unspecified, unspecified trimester: Secondary | ICD-10-CM

## 2012-10-22 MED ORDER — PRENATAL VITAMINS 0.8 MG PO TABS: 1.0000 | ORAL_TABLET | Freq: Every day | ORAL | Status: DC

## 2012-10-24 ENCOUNTER — Ambulatory Visit (INDEPENDENT_AMBULATORY_CARE_PROVIDER_SITE_OTHER): Payer: Medicaid Other | Admitting: Obstetrics and Gynecology

## 2012-10-24 ENCOUNTER — Encounter: Payer: Self-pay | Admitting: Obstetrics and Gynecology

## 2012-10-24 VITALS — BP 118/60 | Wt 213.2 lb

## 2012-10-24 DIAGNOSIS — E669 Obesity, unspecified: Secondary | ICD-10-CM

## 2012-10-24 DIAGNOSIS — O0930 Supervision of pregnancy with insufficient antenatal care, unspecified trimester: Secondary | ICD-10-CM

## 2012-10-24 DIAGNOSIS — Z3403 Encounter for supervision of normal first pregnancy, third trimester: Secondary | ICD-10-CM

## 2012-10-24 DIAGNOSIS — R040 Epistaxis: Secondary | ICD-10-CM

## 2012-10-24 DIAGNOSIS — O99210 Obesity complicating pregnancy, unspecified trimester: Secondary | ICD-10-CM

## 2012-10-24 LAB — POCT URINALYSIS DIP (DEVICE)
Protein, ur: NEGATIVE mg/dL
Urobilinogen, UA: 0.2 mg/dL (ref 0.0–1.0)
pH: 7 (ref 5.0–8.0)

## 2012-10-24 NOTE — Progress Notes (Signed)
Doing well. Undecided re: contraception. Encouraged LARC. Plans breast feed.

## 2012-10-24 NOTE — Progress Notes (Signed)
Pulse: 86

## 2012-10-24 NOTE — Patient Instructions (Addendum)
Pregnancy - Third Trimester  The third trimester of pregnancy (the last 3 months) is a period of the most rapid growth for you and your baby. The baby approaches a length of 20 inches and a weight of 6 to 10 pounds. The baby is adding on fat and getting ready for life outside your body. While inside, babies have periods of sleeping and waking, suck their thumbs, and hiccups. You can often feel small contractions of the uterus. This is false labor. It is also called Braxton-Hicks contractions. This is like a practice for labor. The usual problems in this stage of pregnancy include more difficulty breathing, swelling of the hands and feet from water retention, and having to urinate more often because of the uterus and baby pressing on your bladder.   PRENATAL EXAMS  · Blood work may continue to be done during prenatal exams. These tests are done to check on your health and the probable health of your baby. Blood work is used to follow your blood levels (hemoglobin). Anemia (low hemoglobin) is common during pregnancy. Iron and vitamins are given to help prevent this. You may also continue to be checked for diabetes. Some of the past blood tests may be done again.  · The size of the uterus is measured during each visit. This makes sure your baby is growing properly according to your pregnancy dates.  · Your blood pressure is checked every prenatal visit. This is to make sure you are not getting toxemia.  · Your urine is checked every prenatal visit for infection, diabetes and protein.  · Your weight is checked at each visit. This is done to make sure gains are happening at the suggested rate and that you and your baby are growing normally.  · Sometimes, an ultrasound is performed to confirm the position and the proper growth and development of the baby. This is a test done that bounces harmless sound waves off the baby so your caregiver can more accurately determine due dates.  · Discuss the type of pain medication and  anesthesia you will have during your labor and delivery.  · Discuss the possibility and anesthesia if a Cesarean Section might be necessary.  · Inform your caregiver if there is any mental or physical violence at home.  Sometimes, a specialized non-stress test, contraction stress test and biophysical profile are done to make sure the baby is not having a problem. Checking the amniotic fluid surrounding the baby is called an amniocentesis. The amniotic fluid is removed by sticking a needle into the belly (abdomen). This is sometimes done near the end of pregnancy if an early delivery is required. In this case, it is done to help make sure the baby's lungs are mature enough for the baby to live outside of the womb. If the lungs are not mature and it is unsafe to deliver the baby, an injection of cortisone medication is given to the mother 1 to 2 days before the delivery. This helps the baby's lungs mature and makes it safer to deliver the baby.  CHANGES OCCURING IN THE THIRD TRIMESTER OF PREGNANCY  Your body goes through many changes during pregnancy. They vary from person to person. Talk to your caregiver about changes you notice and are concerned about.  · During the last trimester, you have probably had an increase in your appetite. It is normal to have cravings for certain foods. This varies from person to person and pregnancy to pregnancy.  · You may begin to   get stretch marks on your hips, abdomen, and breasts. These are normal changes in the body during pregnancy. There are no exercises or medications to take which prevent this change.  · Constipation may be treated with a stool softener or adding bulk to your diet. Drinking lots of fluids, fiber in vegetables, fruits, and whole grains are helpful.  · Exercising is also helpful. If you have been very active up until your pregnancy, most of these activities can be continued during your pregnancy. If you have been less active, it is helpful to start an exercise  program such as walking. Consult your caregiver before starting exercise programs.  · Avoid all smoking, alcohol, un-prescribed drugs, herbs and "street drugs" during your pregnancy. These chemicals affect the formation and growth of the baby. Avoid chemicals throughout the pregnancy to ensure the delivery of a healthy infant.  · Backache, varicose veins and hemorrhoids may develop or get worse.  · You will tire more easily in the third trimester, which is normal.  · The baby's movements may be stronger and more often.  · You may become short of breath easily.  · Your belly button may stick out.  · A yellow discharge may leak from your breasts called colostrum.  · You may have a bloody mucus discharge. This usually occurs a few days to a week before labor begins.  HOME CARE INSTRUCTIONS   · Keep your caregiver's appointments. Follow your caregiver's instructions regarding medication use, exercise, and diet.  · During pregnancy, you are providing food for you and your baby. Continue to eat regular, well-balanced meals. Choose foods such as meat, fish, milk and other low fat dairy products, vegetables, fruits, and whole-grain breads and cereals. Your caregiver will tell you of the ideal weight gain.  · A physical sexual relationship may be continued throughout pregnancy if there are no other problems such as early (premature) leaking of amniotic fluid from the membranes, vaginal bleeding, or belly (abdominal) pain.  · Exercise regularly if there are no restrictions. Check with your caregiver if you are unsure of the safety of your exercises. Greater weight gain will occur in the last 2 trimesters of pregnancy. Exercising helps:  · Control your weight.  · Get you in shape for labor and delivery.  · You lose weight after you deliver.  · Rest a lot with legs elevated, or as needed for leg cramps or low back pain.  · Wear a good support or jogging bra for breast tenderness during pregnancy. This may help if worn during  sleep. Pads or tissues may be used in the bra if you are leaking colostrum.  · Do not use hot tubs, steam rooms, or saunas.  · Wear your seat belt when driving. This protects you and your baby if you are in an accident.  · Avoid raw meat, cat litter boxes and soil used by cats. These carry germs that can cause birth defects in the baby.  · It is easier to loose urine during pregnancy. Tightening up and strengthening the pelvic muscles will help with this problem. You can practice stopping your urination while you are going to the bathroom. These are the same muscles you need to strengthen. It is also the muscles you would use if you were trying to stop from passing gas. You can practice tightening these muscles up 10 times a set and repeating this about 3 times per day. Once you know what muscles to tighten up, do not perform these   exercises during urination. It is more likely to cause an infection by backing up the urine.  · Ask for help if you have financial, counseling or nutritional needs during pregnancy. Your caregiver will be able to offer counseling for these needs as well as refer you for other special needs.  · Make a list of emergency phone numbers and have them available.  · Plan on getting help from family or friends when you go home from the hospital.  · Make a trial run to the hospital.  · Take prenatal classes with the father to understand, practice and ask questions about the labor and delivery.  · Prepare the baby's room/nursery.  · Do not travel out of the city unless it is absolutely necessary and with the advice of your caregiver.  · Wear only low or no heal shoes to have better balance and prevent falling.  MEDICATIONS AND DRUG USE IN PREGNANCY  · Take prenatal vitamins as directed. The vitamin should contain 1 milligram of folic acid. Keep all vitamins out of reach of children. Only a couple vitamins or tablets containing iron may be fatal to a baby or young child when ingested.  · Avoid use  of all medications, including herbs, over-the-counter medications, not prescribed or suggested by your caregiver. Only take over-the-counter or prescription medicines for pain, discomfort, or fever as directed by your caregiver. Do not use aspirin, ibuprofen (Motrin®, Advil®, Nuprin®) or naproxen (Aleve®) unless OK'd by your caregiver.  · Let your caregiver also know about herbs you may be using.  · Alcohol is related to a number of birth defects. This includes fetal alcohol syndrome. All alcohol, in any form, should be avoided completely. Smoking will cause low birth rate and premature babies.  · Street/illegal drugs are very harmful to the baby. They are absolutely forbidden. A baby born to an addicted mother will be addicted at birth. The baby will go through the same withdrawal an adult does.  SEEK MEDICAL CARE IF:  You have any concerns or worries during your pregnancy. It is better to call with your questions if you feel they cannot wait, rather than worry about them.  DECISIONS ABOUT CIRCUMCISION  You may or may not know the sex of your baby. If you know your baby is a boy, it may be time to think about circumcision. Circumcision is the removal of the foreskin of the penis. This is the skin that covers the sensitive end of the penis. There is no proven medical need for this. Often this decision is made on what is popular at the time or based upon religious beliefs and social issues. You can discuss these issues with your caregiver or pediatrician.  SEEK IMMEDIATE MEDICAL CARE IF:   · An unexplained oral temperature above 102° F (38.9° C) develops, or as your caregiver suggests.  · You have leaking of fluid from the vagina (birth canal). If leaking membranes are suspected, take your temperature and tell your caregiver of this when you call.  · There is vaginal spotting, bleeding or passing clots. Tell your caregiver of the amount and how many pads are used.  · You develop a bad smelling vaginal discharge with  a change in the color from clear to white.  · You develop vomiting that lasts more than 24 hours.  · You develop chills or fever.  · You develop shortness of breath.  · You develop burning on urination.  · You loose more than 2 pounds of weight   or gain more than 2 pounds of weight or as suggested by your caregiver.  · You notice sudden swelling of your face, hands, and feet or legs.  · You develop belly (abdominal) pain. Round ligament discomfort is a common non-cancerous (benign) cause of abdominal pain in pregnancy. Your caregiver still must evaluate you.  · You develop a severe headache that does not go away.  · You develop visual problems, blurred or double vision.  · If you have not felt your baby move for more than 1 hour. If you think the baby is not moving as much as usual, eat something with sugar in it and lie down on your left side for an hour. The baby should move at least 4 to 5 times per hour. Call right away if your baby moves less than that.  · You fall, are in a car accident or any kind of trauma.  · There is mental or physical violence at home.  Document Released: 07/05/2001 Document Revised: 10/03/2011 Document Reviewed: 01/07/2009  [employer]® Patient Information ©2013 [employer].

## 2012-11-07 ENCOUNTER — Ambulatory Visit (INDEPENDENT_AMBULATORY_CARE_PROVIDER_SITE_OTHER): Payer: Medicaid Other | Admitting: Family Medicine

## 2012-11-07 VITALS — BP 106/71 | Temp 97.8°F | Wt 217.2 lb

## 2012-11-07 DIAGNOSIS — O0933 Supervision of pregnancy with insufficient antenatal care, third trimester: Secondary | ICD-10-CM

## 2012-11-07 DIAGNOSIS — O0930 Supervision of pregnancy with insufficient antenatal care, unspecified trimester: Secondary | ICD-10-CM

## 2012-11-07 LAB — POCT URINALYSIS DIP (DEVICE)
Glucose, UA: NEGATIVE mg/dL
Nitrite: NEGATIVE
Protein, ur: NEGATIVE mg/dL
Specific Gravity, Urine: 1.01 (ref 1.005–1.030)
Urobilinogen, UA: 0.2 mg/dL (ref 0.0–1.0)

## 2012-11-07 NOTE — Progress Notes (Signed)
No concerns. 

## 2012-11-07 NOTE — Progress Notes (Signed)
Pulse- 83 Patient reports pain in lower abdominal and vaginal pressure as well as occasional contractions

## 2012-11-07 NOTE — Patient Instructions (Addendum)
Pregnancy - Third Trimester The third trimester of pregnancy (the last 3 months) is a period of the most rapid growth for you and your baby. The baby approaches a length of 20 inches and a weight of 6 to 10 pounds. The baby is adding on fat and getting ready for life outside your body. While inside, babies have periods of sleeping and waking, suck their thumbs, and hiccups. You can often feel small contractions of the uterus. This is false labor. It is also called Braxton-Hicks contractions. This is like a practice for labor. The usual problems in this stage of pregnancy include more difficulty breathing, swelling of the hands and feet from water retention, and having to urinate more often because of the uterus and baby pressing on your bladder.  PRENATAL EXAMS  Blood work may continue to be done during prenatal exams. These tests are done to check on your health and the probable health of your baby. Blood work is used to follow your blood levels (hemoglobin). Anemia (low hemoglobin) is common during pregnancy. Iron and vitamins are given to help prevent this. You may also continue to be checked for diabetes. Some of the past blood tests may be done again.  The size of the uterus is measured during each visit. This makes sure your baby is growing properly according to your pregnancy dates.  Your blood pressure is checked every prenatal visit. This is to make sure you are not getting toxemia.  Your urine is checked every prenatal visit for infection, diabetes and protein.  Your weight is checked at each visit. This is done to make sure gains are happening at the suggested rate and that you and your baby are growing normally.  Sometimes, an ultrasound is performed to confirm the position and the proper growth and development of the baby. This is a test done that bounces harmless sound waves off the baby so your caregiver can more accurately determine due dates.  Discuss the type of pain medication and  anesthesia you will have during your labor and delivery.  Discuss the possibility and anesthesia if a Cesarean Section might be necessary.  Inform your caregiver if there is any mental or physical violence at home. Sometimes, a specialized non-stress test, contraction stress test and biophysical profile are done to make sure the baby is not having a problem. Checking the amniotic fluid surrounding the baby is called an amniocentesis. The amniotic fluid is removed by sticking a needle into the belly (abdomen). This is sometimes done near the end of pregnancy if an early delivery is required. In this case, it is done to help make sure the baby's lungs are mature enough for the baby to live outside of the womb. If the lungs are not mature and it is unsafe to deliver the baby, an injection of cortisone medication is given to the mother 1 to 2 days before the delivery. This helps the baby's lungs mature and makes it safer to deliver the baby. CHANGES OCCURING IN THE THIRD TRIMESTER OF PREGNANCY Your body goes through many changes during pregnancy. They vary from person to person. Talk to your caregiver about changes you notice and are concerned about.  During the last trimester, you have probably had an increase in your appetite. It is normal to have cravings for certain foods. This varies from person to person and pregnancy to pregnancy.  You may begin to get stretch marks on your hips, abdomen, and breasts. These are normal changes in the body  during pregnancy. There are no exercises or medications to take which prevent this change.  Constipation may be treated with a stool softener or adding bulk to your diet. Drinking lots of fluids, fiber in vegetables, fruits, and whole grains are helpful.  Exercising is also helpful. If you have been very active up until your pregnancy, most of these activities can be continued during your pregnancy. If you have been less active, it is helpful to start an exercise  program such as walking. Consult your caregiver before starting exercise programs.  Avoid all smoking, alcohol, un-prescribed drugs, herbs and "street drugs" during your pregnancy. These chemicals affect the formation and growth of the baby. Avoid chemicals throughout the pregnancy to ensure the delivery of a healthy infant.  Backache, varicose veins and hemorrhoids may develop or get worse.  You will tire more easily in the third trimester, which is normal.  The baby's movements may be stronger and more often.  You may become short of breath easily.  Your belly button may stick out.  A yellow discharge may leak from your breasts called colostrum.  You may have a bloody mucus discharge. This usually occurs a few days to a week before labor begins. HOME CARE INSTRUCTIONS   Keep your caregiver's appointments. Follow your caregiver's instructions regarding medication use, exercise, and diet.  During pregnancy, you are providing food for you and your baby. Continue to eat regular, well-balanced meals. Choose foods such as meat, fish, milk and other low fat dairy products, vegetables, fruits, and whole-grain breads and cereals. Your caregiver will tell you of the ideal weight gain.  A physical sexual relationship may be continued throughout pregnancy if there are no other problems such as early (premature) leaking of amniotic fluid from the membranes, vaginal bleeding, or belly (abdominal) pain.  Exercise regularly if there are no restrictions. Check with your caregiver if you are unsure of the safety of your exercises. Greater weight gain will occur in the last 2 trimesters of pregnancy. Exercising helps:  Control your weight.  Get you in shape for labor and delivery.  You lose weight after you deliver.  Rest a lot with legs elevated, or as needed for leg cramps or low back pain.  Wear a good support or jogging bra for breast tenderness during pregnancy. This may help if worn during  sleep. Pads or tissues may be used in the bra if you are leaking colostrum.  Do not use hot tubs, steam rooms, or saunas.  Wear your seat belt when driving. This protects you and your baby if you are in an accident.  Avoid raw meat, cat litter boxes and soil used by cats. These carry germs that can cause birth defects in the baby.  It is easier to loose urine during pregnancy. Tightening up and strengthening the pelvic muscles will help with this problem. You can practice stopping your urination while you are going to the bathroom. These are the same muscles you need to strengthen. It is also the muscles you would use if you were trying to stop from passing gas. You can practice tightening these muscles up 10 times a set and repeating this about 3 times per day. Once you know what muscles to tighten up, do not perform these exercises during urination. It is more likely to cause an infection by backing up the urine.  Ask for help if you have financial, counseling or nutritional needs during pregnancy. Your caregiver will be able to offer counseling for these  needs as well as refer you for other special needs.  Make a list of emergency phone numbers and have them available.  Plan on getting help from family or friends when you go home from the hospital.  Make a trial run to the hospital.  Take prenatal classes with the father to understand, practice and ask questions about the labor and delivery.  Prepare the baby's room/nursery.  Do not travel out of the city unless it is absolutely necessary and with the advice of your caregiver.  Wear only low or no heal shoes to have better balance and prevent falling. MEDICATIONS AND DRUG USE IN PREGNANCY  Take prenatal vitamins as directed. The vitamin should contain 1 milligram of folic acid. Keep all vitamins out of reach of children. Only a couple vitamins or tablets containing iron may be fatal to a baby or young child when ingested.  Avoid use  of all medications, including herbs, over-the-counter medications, not prescribed or suggested by your caregiver. Only take over-the-counter or prescription medicines for pain, discomfort, or fever as directed by your caregiver. Do not use aspirin, ibuprofen (Motrin, Advil, Nuprin) or naproxen (Aleve) unless OK'd by your caregiver.  Let your caregiver also know about herbs you may be using.  Alcohol is related to a number of birth defects. This includes fetal alcohol syndrome. All alcohol, in any form, should be avoided completely. Smoking will cause low birth rate and premature babies.  Street/illegal drugs are very harmful to the baby. They are absolutely forbidden. A baby born to an addicted mother will be addicted at birth. The baby will go through the same withdrawal an adult does. SEEK MEDICAL CARE IF: You have any concerns or worries during your pregnancy. It is better to call with your questions if you feel they cannot wait, rather than worry about them. DECISIONS ABOUT CIRCUMCISION You may or may not know the sex of your baby. If you know your baby is a boy, it may be time to think about circumcision. Circumcision is the removal of the foreskin of the penis. This is the skin that covers the sensitive end of the penis. There is no proven medical need for this. Often this decision is made on what is popular at the time or based upon religious beliefs and social issues. You can discuss these issues with your caregiver or pediatrician. SEEK IMMEDIATE MEDICAL CARE IF:   An unexplained oral temperature above 102 F (38.9 C) develops, or as your caregiver suggests.  You have leaking of fluid from the vagina (birth canal). If leaking membranes are suspected, take your temperature and tell your caregiver of this when you call.  There is vaginal spotting, bleeding or passing clots. Tell your caregiver of the amount and how many pads are used.  You develop a bad smelling vaginal discharge with  a change in the color from clear to white.  You develop vomiting that lasts more than 24 hours.  You develop chills or fever.  You develop shortness of breath.  You develop burning on urination.  You loose more than 2 pounds of weight or gain more than 2 pounds of weight or as suggested by your caregiver.  You notice sudden swelling of your face, hands, and feet or legs.  You develop belly (abdominal) pain. Round ligament discomfort is a common non-cancerous (benign) cause of abdominal pain in pregnancy. Your caregiver still must evaluate you.  You develop a severe headache that does not go away.  You develop visual  problems, blurred or double vision.  If you have not felt your baby move for more than 1 hour. If you think the baby is not moving as much as usual, eat something with sugar in it and lie down on your left side for an hour. The baby should move at least 4 to 5 times per hour. Call right away if your baby moves less than that.  You fall, are in a car accident or any kind of trauma.  There is mental or physical violence at home. Document Released: 07/05/2001 Document Revised: 10/03/2011 Document Reviewed: 01/07/2009 [facility] Patient Information 2013 [place], [place].  Normal Labor and Delivery Your caregiver must first be sure you are in labor. Signs of labor include:  You may pass what is called "the mucus plug" before labor begins. This is a small amount of blood stained mucus.  Regular uterine contractions.  The time between contractions get closer together.  The discomfort and pain gradually gets more intense.  Pains are mostly located in the back.  Pains get worse when walking.  The cervix (the opening of the uterus becomes thinner (begins to efface) and opens up (dilates). Once you are in labor and admitted into the hospital or care center, your caregiver will do the following:  A complete physical examination.  Check your vital signs (blood pressure,  pulse, temperature and the fetal heart rate).  Do a vaginal examination (using a sterile glove and lubricant) to determine:  The position (presentation) of the baby (head [vertex] or buttock first).  The level (station) of the baby's head in the birth canal.  The effacement and dilatation of the cervix.  You may have your pubic hair shaved and be given an enema depending on your caregiver and the circumstance.  An electronic monitor is usually placed on your abdomen. The monitor follows the length and intensity of the contractions, as well as the baby's heart rate.  Usually, your caregiver will insert an IV in your arm with a bottle of sugar water. This is done as a precaution so that medications can be given to you quickly during labor or delivery. NORMAL LABOR AND DELIVERY IS DIVIDED UP INTO 3 STAGES: First Stage This is when regular contractions begin and the cervix begins to efface and dilate. This stage can last from 3 to 15 hours. The end of the first stage is when the cervix is 100% effaced and 10 centimeters dilated. Pain medications may be given by   Injection (morphine, demerol, etc.)  Regional anesthesia (spinal, caudal or epidural, anesthetics given in different locations of the spine). Paracervical pain medication may be given, which is an injection of and anesthetic on each side of the cervix. A pregnant woman may request to have "Natural Childbirth" which is not to have any medications or anesthesia during her labor and delivery. Second Stage This is when the baby comes down through the birth canal (vagina) and is born. This can take 1 to 4 hours. As the baby's head comes down through the birth canal, you may feel like you are going to have a bowel movement. You will get the urge to bear down and push until the baby is delivered. As the baby's head is being delivered, the caregiver will decide if an episiotomy (a cut in the perineum and vagina area) is needed to prevent tearing  of the tissue in this area. The episiotomy is sewn up after the delivery of the baby and placenta. Sometimes a mask with nitrous  oxide is given for the mother to breath during the delivery of the baby to help if there is too much pain. The end of Stage 2 is when the baby is fully delivered. Then when the umbilical cord stops pulsating it is clamped and cut. Third Stage The third stage begins after the baby is completely delivered and ends after the placenta (afterbirth) is delivered. This usually takes 5 to 30 minutes. After the placenta is delivered, a medication is given either by intravenous or injection to help contract the uterus and prevent bleeding. The third stage is not painful and pain medication is usually not necessary. If an episiotomy was done, it is repaired at this time. After the delivery, the mother is watched and monitored closely for 1 to 2 hours to make sure there is no postpartum bleeding (hemorrhage). If there is a lot of bleeding, medication is given to contract the uterus and stop the bleeding. Document Released: 04/19/2008 Document Revised: 10/03/2011 Document Reviewed: 04/19/2008 [facility] Patient Information 2013 [place], [place].  Contraception Choices Contraception (birth control) is the use of any methods or devices to prevent pregnancy. Below are some methods to help avoid pregnancy. HORMONAL METHODS   Contraceptive implant. This is a thin, plastic tube containing progesterone hormone. It does not contain estrogen hormone. Your caregiver inserts the tube in the inner part of the upper arm. The tube can remain in place for up to 3 years. After 3 years, the implant must be removed. The implant prevents the ovaries from releasing an egg (ovulation), thickens the cervical mucus which prevents sperm from entering the uterus, and thins the lining of the inside of the uterus.  Progesterone-only injections. These injections are given every 3 months by your caregiver to prevent  pregnancy. This synthetic progesterone hormone stops the ovaries from releasing eggs. It also thickens cervical mucus and changes the uterine lining. This makes it harder for sperm to survive in the uterus.  Birth control pills. These pills contain estrogen and progesterone hormone. They work by stopping the egg from forming in the ovary (ovulation). Birth control pills are prescribed by a caregiver.Birth control pills can also be used to treat heavy periods.  Minipill. This type of birth control pill contains only the progesterone hormone. They are taken every day of each month and must be prescribed by your caregiver.  Birth control patch. The patch contains hormones similar to those in birth control pills. It must be changed once a week and is prescribed by a caregiver.  Vaginal ring. The ring contains hormones similar to those in birth control pills. It is left in the vagina for 3 weeks, removed for 1 week, and then a new one is put back in place. The patient must be comfortable inserting and removing the ring from the vagina.A caregiver's prescription is necessary.  Emergency contraception. Emergency contraceptives prevent pregnancy after unprotected sexual intercourse. This pill can be taken right after sex or up to 5 days after unprotected sex. It is most effective the sooner you take the pills after having sexual intercourse. Emergency contraceptive pills are available without a prescription. Check with your pharmacist. Do not use emergency contraception as your only form of birth control. BARRIER METHODS   Female condom. This is a thin sheath (latex or rubber) that is worn over the penis during sexual intercourse. It can be used with spermicide to increase effectiveness.  Female condom. This is a soft, loose-fitting sheath that is put into the vagina before sexual intercourse.  Diaphragm. This is a soft, latex, dome-shaped barrier that must be fitted by a caregiver. It is inserted into the  vagina, along with a spermicidal jelly. It is inserted before intercourse. The diaphragm should be left in the vagina for 6 to 8 hours after intercourse.  Cervical cap. This is a round, soft, latex or plastic cup that fits over the cervix and must be fitted by a caregiver. The cap can be left in place for up to 48 hours after intercourse.  Sponge. This is a soft, circular piece of polyurethane foam. The sponge has spermicide in it. It is inserted into the vagina after wetting it and before sexual intercourse.  Spermicides. These are chemicals that kill or block sperm from entering the cervix and uterus. They come in the form of creams, jellies, suppositories, foam, or tablets. They do not require a prescription. They are inserted into the vagina with an applicator before having sexual intercourse. The process must be repeated every time you have sexual intercourse. INTRAUTERINE CONTRACEPTION  Intrauterine device (IUD). This is a T-shaped device that is put in a woman's uterus during a menstrual period to prevent pregnancy. There are 2 types:  Copper IUD. This type of IUD is wrapped in copper wire and is placed inside the uterus. Copper makes the uterus and fallopian tubes produce a fluid that kills sperm. It can stay in place for 10 years.  Hormone IUD. This type of IUD contains the hormone progestin (synthetic progesterone). The hormone thickens the cervical mucus and prevents sperm from entering the uterus, and it also thins the uterine lining to prevent implantation of a fertilized egg. The hormone can weaken or kill the sperm that get into the uterus. It can stay in place for 5 years. PERMANENT METHODS OF CONTRACEPTION  Female tubal ligation. This is when the woman's fallopian tubes are surgically sealed, tied, or blocked to prevent the egg from traveling to the uterus.  Female sterilization. This is when the female has the tubes that carry sperm tied off (vasectomy).This blocks sperm from  entering the vagina during sexual intercourse. After the procedure, the man can still ejaculate fluid (semen). NATURAL PLANNING METHODS  Natural family planning. This is not having sexual intercourse or using a barrier method (condom, diaphragm, cervical cap) on days the woman could become pregnant.  Calendar method. This is keeping track of the length of each menstrual cycle and identifying when you are fertile.  Ovulation method. This is avoiding sexual intercourse during ovulation.  Symptothermal method. This is avoiding sexual intercourse during ovulation, using a thermometer and ovulation symptoms.  Post-ovulation method. This is timing sexual intercourse after you have ovulated. Regardless of which type or method of contraception you choose, it is important that you use condoms to protect against the transmission of sexually transmitted diseases (STDs). Talk with your caregiver about which form of contraception is most appropriate for you. Document Released: 07/11/2005 Document Revised: 10/03/2011 Document Reviewed: 11/17/2010 [facility] Patient Information 2013 [place], [place].  Epidural Risks and Benefits The continuous putting in (infusion) of local anesthetics through a long, narrow, hollow plastic tube (catheter)/needle into the lower (lumbar) area of your spine is commonly called an epidural. This means outside the covering of the spinal cord. The epidural catheter is placed in the space on the outside of the membrane that covers the spinal cord. The anesthetic medicine numbs the nerves of the spinal cord in the epidural space. There is also a spinal/epidural anesthetic using two needles and a catheter.  The medication is first placed in the spinal canal. Then that needle is removed and a catheter is placed in the epidural space through the second needle for continuous anesthesia. This seems to be the most popular type of regional anesthesia used now. This is sometimes given for pain  management to women who are giving birth. Spinal and epidural anesthesia are called regional anesthesia because they numb a certain region of the body. While it is an effective pain management tool, some reasons not to use this include:  Restricted mobility: The tubes and monitors connected to you do not allow for much moving around.  Increased likelihood of bladder catheterization, oxytocin administration, and internal monitoring. This means a tube (catheter) may have to be put into the bladder to drain the urine. Uterine contractions can become weaker and less frequent. They also may have a higher use of oxytocin than mothers not having regional anesthesia.  Increased likelihood of operative delivery: This includes the use of or need for forceps, vacuum extractor, episiotomy, or cesarean delivery. When the dose is too large, or when it sinks down into the "tailbone" (sacral) region of the body, the perineum and the birth canal (vagina) are anesthetized. Anesthetic is injected into this area late in labor to deaden all sensation. When it "accidentally" happens earlier in labor, the muscles of the pelvic floor are relaxed too early. This interferes with the normal flexion and rotation of the baby's head as it passes through the birth canal. This interference can lead to abnormal presentations that are more dangerous for the baby.  Must use an automatic blood pressure cuff throughout labor. This is a cuff that automatically takes your blood pressure at regular intervals. SHORT TERM MATERNAL RISKS  Dural puncture - The dura is one of the membranes surrounding the spinal cord. If the anesthetic medication gets into the spinal canal through a dural puncture, it can result in a spinal anesthetic and spinal headache. Spinal headaches are treated with an epidural blood patch to cover the punctured area.  Low blood pressure (hypotension) - Nearly one third of women with an epidural will develop low blood  pressure. The ways that patients must lay during the epidural can make this worse. Their position is limited because they will be unable to move their legs easily for the time of the anesthetic. Low blood pressure is also a risk for the baby. If the baby does not get enough oxygen from the mom's blood, it can result in an emergency Cesarean section. This means the baby is delivered by an operation through a cut by the surgeon (incision) on the belly of the mother.  Nausea, vomiting, and prolonged shivering.  Prolonged labor - With large doses of anesthetic medication, the patient loses the desire and the ability to bear down and push. This results in an increased use of forceps and vacuum extractions, compared to women having unmedicated deliveries.  Uneven, incomplete or non-existent pain relief. Sometimes the epidural does not work well and additional medications may be needed for pain relief.  Difficulty breathing well or paralysis if the level of anesthesia goes too high in the spine.  Convulsions - If the anesthetic agent accidentally is injected into a blood vessel it can cause convulsions and loss of consciousness.  Toxic drug reactions.  Septic meningitis - An abscess can form at the site where the epidural catheter is placed. If this spreads into the spinal canal it can cause meningitis.  Allergic reaction - This causes blood  pressure to become too low and other medications and fluids must be given to bring the blood pressure up. Also rashes and difficulty breathing may develop.  Cardiac arrest - This is rare but real threat to the life of the mother and baby.  Fever is common.  Itching that is easily treated.  Spinal hematoma. LONG TERM MATERNAL RISKS  Neurological complications - A nerve problem called Horner's syndrome can develop with epidural anesthesia for vaginal delivery. It is impossible to predict which patients will develop a Horner's syndrome. Even the nerves to the  face can be blocked, temporarily or permanently. Tremors and shakes can occur.  Paresthesia ("pins and needles"). This is a feeling that comes from inflammation of a nerve.  Dizziness and fainting can become a problem after epidurals. This is usually only for a couple of days. RISKS TO BABY  Direct drug toxicity.  Fetal distress, abnormal fetal heart rate (FHR) (can lead to emergency cesarean). This is especially true if the anesthetic gets into the mother's blood stream or too much medication is put into the epidural. REASONS NOT TO HAVE EPIDURAL ANESTHESIA  Increased costs.  The mother has a low blood pressure.  There are blood clotting problems.  A brain tumor is present.  There is an infection in the blood stream.  A skin infection at the needle site.  A tattoo at the needle site. BENEFITS  Regional anesthesia is the most effective pain relief for labor and delivery.  It is the best anesthetic for preeclampsia and eclampsia.  There is better pain control after delivery (vaginal or cesarean).  When done correctly, no medication gets to the baby.  Sooner ambulation after delivery.  It can be left in place during all of labor.  You can be awake during a Cesarean delivery and see the baby immediately after delivery. AFTER THE PROCEDURE   You will be kept in bed for several hours to prevent headaches.  You will be kept in bed until your legs are no longer numb and it is safe to walk.  The length of time you spend in the hospital will depend on the type of surgery or procedure you have had.  The epidural catheter is removed after you no longer need it for pain. HOME CARE INSTRUCTIONS   Do not drive or operate any kind of machinery for at least 24 hours. Make sure there is someone to drive you home.  Do not drink alcohol for at least 24 hours after the anesthesia.  Do not make important decisions for at least 24 hours after the anesthesia.  Drink lots of  fluids.  Return to your normal diet.  Keep all your postoperative appointments as scheduled. SEEK IMMEDIATE MEDICAL CARE IF:  You develop a fever or temperature over 98.6 F (37 C).  You have a persistent headache.  You develop dizziness, fainting or lightheadedness.  You develop weakness, numbness or tingling in your arms or legs.  You have a skin rash.  You have difficulty breathing  You have a stiff neck with or without stiff back.  You develop chest pain. Document Released: 07/11/2005 Document Revised: 10/03/2011 Document Reviewed: 08/18/2008 [facility] Patient Information 2013 [place], [place].

## 2012-11-14 ENCOUNTER — Ambulatory Visit (INDEPENDENT_AMBULATORY_CARE_PROVIDER_SITE_OTHER): Payer: Medicaid Other | Admitting: Obstetrics and Gynecology

## 2012-11-14 VITALS — BP 131/71 | Temp 97.2°F | Wt 217.5 lb

## 2012-11-14 DIAGNOSIS — O0933 Supervision of pregnancy with insufficient antenatal care, third trimester: Secondary | ICD-10-CM

## 2012-11-14 DIAGNOSIS — O0930 Supervision of pregnancy with insufficient antenatal care, unspecified trimester: Secondary | ICD-10-CM

## 2012-11-14 LAB — POCT URINALYSIS DIP (DEVICE)
Bilirubin Urine: NEGATIVE
Glucose, UA: NEGATIVE mg/dL
Hgb urine dipstick: NEGATIVE
Nitrite: NEGATIVE
Urobilinogen, UA: 1 mg/dL (ref 0.0–1.0)
pH: 7 (ref 5.0–8.0)

## 2012-11-14 NOTE — Patient Instructions (Addendum)
Braxton Hicks Contractions Pregnancy is commonly associated with contractions of the uterus throughout the pregnancy. Towards the end of pregnancy (32 to 34 weeks), these contractions [facility] Willa Rough) can develop more often and may become more forceful. This is not true labor because these contractions do not result in opening (dilatation) and thinning of the cervix. They are sometimes difficult to tell apart from true labor because these contractions can be forceful and people have different pain tolerances. You should not feel embarrassed if you go to the hospital with false labor. Sometimes, the only way to tell if you are in true labor is for your caregiver to follow the changes in the cervix. How to tell the difference between true and false labor:  False labor.  The contractions of false labor are usually shorter, irregular and not as hard as those of true labor.  They are often felt in the front of the lower abdomen and in the groin.  They may leave with walking around or changing positions while lying down.  They get weaker and are shorter lasting as time goes on.  These contractions are usually irregular.  They do not usually become progressively stronger, regular and closer together as with true labor.  True labor.  Contractions in true labor last 30 to 70 seconds, become very regular, usually become more intense, and increase in frequency.  They do not go away with walking.  The discomfort is usually felt in the top of the uterus and spreads to the lower abdomen and low back.  True labor can be determined by your caregiver with an exam. This will show that the cervix is dilating and getting thinner. If there are no prenatal problems or other health problems associated with the pregnancy, it is completely safe to be sent home with false labor and await the onset of true labor. HOME CARE INSTRUCTIONS   Keep up with your usual exercises and instructions.  Take medications as  directed.  Keep your regular prenatal appointment.  Eat and drink lightly if you think you are going into labor.  If BH contractions are making you uncomfortable:  Change your activity position from lying down or resting to walking/walking to resting.  Sit and rest in a tub of warm water.  Drink 2 to 3 glasses of water. Dehydration may cause B-H contractions.  Do slow and deep breathing several times an hour. SEEK IMMEDIATE MEDICAL CARE IF:   Your contractions continue to become stronger, more regular, and closer together.  You have a gushing, burst or leaking of fluid from the vagina.  An oral temperature above 102 F (38.9 C) develops.  You have passage of blood-tinged mucus.  You develop vaginal bleeding.  You develop continuous belly (abdominal) pain.  You have low back pain that you never had before.  You feel the baby's head pushing down causing pelvic pressure.  The baby is not moving as much as it used to. Document Released: 07/11/2005 Document Revised: 10/03/2011 Document Reviewed: 01/02/2009 [facility] Patient Information 2013 [place], [place]. Contraception Choices Contraception (birth control) is the use of any methods or devices to prevent pregnancy. Below are some methods to help avoid pregnancy. HORMONAL METHODS   Contraceptive implant. This is a thin, plastic tube containing progesterone hormone. It does not contain estrogen hormone. Your caregiver inserts the tube in the inner part of the upper arm. The tube can remain in place for up to 3 years. After 3 years, the implant must be removed. The implant prevents  the ovaries from releasing an egg (ovulation), thickens the cervical mucus which prevents sperm from entering the uterus, and thins the lining of the inside of the uterus.  Progesterone-only injections. These injections are given every 3 months by your caregiver to prevent pregnancy. This synthetic progesterone hormone stops the ovaries from releasing  eggs. It also thickens cervical mucus and changes the uterine lining. This makes it harder for sperm to survive in the uterus.  Birth control pills. These pills contain estrogen and progesterone hormone. They work by stopping the egg from forming in the ovary (ovulation). Birth control pills are prescribed by a caregiver.Birth control pills can also be used to treat heavy periods.  Minipill. This type of birth control pill contains only the progesterone hormone. They are taken every day of each month and must be prescribed by your caregiver.  Birth control patch. The patch contains hormones similar to those in birth control pills. It must be changed once a week and is prescribed by a caregiver.  Vaginal ring. The ring contains hormones similar to those in birth control pills. It is left in the vagina for 3 weeks, removed for 1 week, and then a new one is put back in place. The patient must be comfortable inserting and removing the ring from the vagina.A caregiver's prescription is necessary.  Emergency contraception. Emergency contraceptives prevent pregnancy after unprotected sexual intercourse. This pill can be taken right after sex or up to 5 days after unprotected sex. It is most effective the sooner you take the pills after having sexual intercourse. Emergency contraceptive pills are available without a prescription. Check with your pharmacist. Do not use emergency contraception as your only form of birth control. BARRIER METHODS   Female condom. This is a thin sheath (latex or rubber) that is worn over the penis during sexual intercourse. It can be used with spermicide to increase effectiveness.  Female condom. This is a soft, loose-fitting sheath that is put into the vagina before sexual intercourse.  Diaphragm. This is a soft, latex, dome-shaped barrier that must be fitted by a caregiver. It is inserted into the vagina, along with a spermicidal jelly. It is inserted before intercourse. The  diaphragm should be left in the vagina for 6 to 8 hours after intercourse.  Cervical cap. This is a round, soft, latex or plastic cup that fits over the cervix and must be fitted by a caregiver. The cap can be left in place for up to 48 hours after intercourse.  Sponge. This is a soft, circular piece of polyurethane foam. The sponge has spermicide in it. It is inserted into the vagina after wetting it and before sexual intercourse.  Spermicides. These are chemicals that kill or block sperm from entering the cervix and uterus. They come in the form of creams, jellies, suppositories, foam, or tablets. They do not require a prescription. They are inserted into the vagina with an applicator before having sexual intercourse. The process must be repeated every time you have sexual intercourse. INTRAUTERINE CONTRACEPTION  Intrauterine device (IUD). This is a T-shaped device that is put in a woman's uterus during a menstrual period to prevent pregnancy. There are 2 types:  Copper IUD. This type of IUD is wrapped in copper wire and is placed inside the uterus. Copper makes the uterus and fallopian tubes produce a fluid that kills sperm. It can stay in place for 10 years.  Hormone IUD. This type of IUD contains the hormone progestin (synthetic progesterone). The hormone thickens  the cervical mucus and prevents sperm from entering the uterus, and it also thins the uterine lining to prevent implantation of a fertilized egg. The hormone can weaken or kill the sperm that get into the uterus. It can stay in place for 5 years. PERMANENT METHODS OF CONTRACEPTION  Female tubal ligation. This is when the woman's fallopian tubes are surgically sealed, tied, or blocked to prevent the egg from traveling to the uterus.  Female sterilization. This is when the female has the tubes that carry sperm tied off (vasectomy).This blocks sperm from entering the vagina during sexual intercourse. After the procedure, the man can still  ejaculate fluid (semen). NATURAL PLANNING METHODS  Natural family planning. This is not having sexual intercourse or using a barrier method (condom, diaphragm, cervical cap) on days the woman could become pregnant.  Calendar method. This is keeping track of the length of each menstrual cycle and identifying when you are fertile.  Ovulation method. This is avoiding sexual intercourse during ovulation.  Symptothermal method. This is avoiding sexual intercourse during ovulation, using a thermometer and ovulation symptoms.  Post-ovulation method. This is timing sexual intercourse after you have ovulated. Regardless of which type or method of contraception you choose, it is important that you use condoms to protect against the transmission of sexually transmitted diseases (STDs). Talk with your caregiver about which form of contraception is most appropriate for you. Document Released: 07/11/2005 Document Revised: 10/03/2011 Document Reviewed: 11/17/2010 [facility] Patient Information 2013 [place], [place].

## 2012-11-14 NOTE — Progress Notes (Signed)
UCs more uncomfortable. States 160# pre-preg. Plans breastfeed,circ, undecided contraception> LARC recommended and information given.

## 2012-11-21 ENCOUNTER — Encounter: Payer: Self-pay | Admitting: Family Medicine

## 2012-11-26 ENCOUNTER — Encounter: Payer: Self-pay | Admitting: Advanced Practice Midwife

## 2012-12-03 ENCOUNTER — Inpatient Hospital Stay (HOSPITAL_COMMUNITY)
Admission: AD | Admit: 2012-12-03 | Discharge: 2012-12-03 | Disposition: A | Discharge status: Home / Self Care | Payer: Medicaid Other | Source: Ambulatory Visit | Attending: Obstetrics & Gynecology | Admitting: Obstetrics & Gynecology

## 2012-12-03 ENCOUNTER — Encounter (HOSPITAL_COMMUNITY): Payer: Self-pay | Admitting: *Deleted

## 2012-12-03 DIAGNOSIS — O99891 Other specified diseases and conditions complicating pregnancy: Secondary | ICD-10-CM | POA: Insufficient documentation

## 2012-12-03 DIAGNOSIS — N898 Other specified noninflammatory disorders of vagina: Secondary | ICD-10-CM

## 2012-12-03 DIAGNOSIS — O479 False labor, unspecified: Secondary | ICD-10-CM

## 2012-12-03 NOTE — MAU Note (Signed)
Patient is in with c/o possible srom about an hour. No sign of obvious rupture of fluids. Patient denies vaginal bleeding. She reports good fetal movement

## 2012-12-03 NOTE — MAU Provider Note (Signed)
History     CSN: [id number]  Arrival date and time: 12/03/12 1425   First Provider Initiated Contact with Patient 12/03/12 1544      Chief Complaint  Patient presents with  . Rupture of Membranes   HPI This is a 22 y.o. female at [redacted]w[redacted]d who presents with c/o leaking episode. Denies painful contractions. Reports + fetal movement.  RN Note: Patient is in with c/o possible srom about an hour. No sign of obvious rupture of fluids. Patient denies vaginal bleeding. She reports good fetal movement      OB History   Grav Para Term Preterm Abortions TAB SAB Ect Mult Living   2    1  1    0      Past Medical History  Diagnosis Date  . Seasonal allergies   . No pertinent past medical history     Past Surgical History  Procedure Laterality Date  . No past surgeries      Family History  Problem Relation Age of Onset  . Hypertension Mother   . Hypertension Sister     History  Substance Use Topics  . Smoking status: Former Smoker    Types: Cigarettes    Quit date: 03/05/2012  . Smokeless tobacco: Former Neurosurgeon    Quit date: 03/03/2011  . Alcohol Use: No    Allergies:  Allergies  Allergen Reactions  . Ibuprofen Hives and Swelling    Patient states she can take generic Ibuprofen with no reaction. States Advil caused swelling/hives.    Prescriptions prior to admission  Medication Sig Dispense Refill  . loratadine (CLARITIN) 10 MG tablet Take 10 mg by mouth daily.        . Prenatal Vit-Fe Fumarate-FA (PRENATAL MULTIVITAMIN) TABS Take 1 tablet by mouth daily at 12 noon.        Review of Systems  Constitutional: Negative for fever, chills and malaise/fatigue.  Gastrointestinal: Negative for nausea, vomiting, abdominal pain, diarrhea and constipation.  Genitourinary: Negative for dysuria.       One episode of leaking fluids   Neurological: Negative for headaches.   Physical Exam   Blood pressure 127/82, pulse 87, temperature 97.6 F (36.4 C), temperature source  Oral, resp. rate 18, last menstrual period 01/31/2012.  Physical Exam  Constitutional: She is oriented to person, place, and time. She appears well-developed and well-nourished.  HENT:  Head: Normocephalic.  Cardiovascular: Normal rate.   Respiratory: Effort normal.  GI: Soft. She exhibits no distension. There is no tenderness. There is no rebound and no guarding.  Genitourinary: Vagina normal and uterus normal. No vaginal discharge found.  No leaking, no ferning Dilation: 1 Effacement (%): 30 Cervical Position: Posterior Exam by:: Elie Confer RN   Musculoskeletal: Normal range of motion.  Neurological: She is alert and oriented to person, place, and time.  Skin: Skin is warm and dry.  Psychiatric: She has a normal mood and affect.  Fetal heart rate reactive Uterine irritability, not felt by pt   MAU Course  Procedures   Assessment and Plan  A:  SIUP at [redacted]w[redacted]d       Uterine irritabiity      No evidence of ROM  P:  Discharge        Has missed last two OB appointments      Will schedule NST for later this week       IOL scheduled for next week, 5/22 at 7:30pm for Cytotec      Labor precautions  [facility] 12/03/2012,  4:02 PM

## 2012-12-04 ENCOUNTER — Telehealth (HOSPITAL_COMMUNITY): Payer: Self-pay | Admitting: *Deleted

## 2012-12-04 NOTE — Telephone Encounter (Signed)
Preadmission screen  

## 2012-12-05 ENCOUNTER — Encounter (HOSPITAL_COMMUNITY): Payer: Self-pay | Admitting: *Deleted

## 2012-12-05 ENCOUNTER — Inpatient Hospital Stay (HOSPITAL_COMMUNITY)
Admission: AD | Admit: 2012-12-05 | Discharge: 2012-12-05 | Disposition: A | Discharge status: Home / Self Care | Payer: Medicaid Other | Source: Ambulatory Visit | Attending: Obstetrics and Gynecology | Admitting: Obstetrics and Gynecology

## 2012-12-05 DIAGNOSIS — O479 False labor, unspecified: Secondary | ICD-10-CM

## 2012-12-05 DIAGNOSIS — O4690 Antepartum hemorrhage, unspecified, unspecified trimester: Secondary | ICD-10-CM | POA: Insufficient documentation

## 2012-12-05 NOTE — MAU Note (Signed)
Up several times during the nights; leaking waking her.  This morning when went to bathroom, noted bloody/ clear mucous. Having pains in lower back. Pelvic pressure.

## 2012-12-05 NOTE — MAU Provider Note (Signed)
History     CSN: [id number]  Arrival date and time: 12/05/12 1126   None     Chief Complaint  Patient presents with  . Labor Eval   HPI Whitney Yang is a 22 y.o. female who is G2P0010 at [redacted]w[redacted]d who presents to the MAU who states she had noticed a clear blood tinged discharge 2 hours ago and contractions becoming more frequent since early this morning.  The contractions are mild, and she can easily breathe and talk through them. She states she has felt the baby move often and it is actively trying to push down.  She states she is eating and drinking fine, no headaches or fevers, and she is not having any dysuria, nausea, vomiting or diarrhea. OB History   Grav Para Term Preterm Abortions TAB SAB Ect Mult Living   2    1  1    0      Past Medical History  Diagnosis Date  . Seasonal allergies   . No pertinent past medical history     Past Surgical History  Procedure Laterality Date  . No past surgeries      Family History  Problem Relation Age of Onset  . Hypertension Mother   . Hypertension Sister     History  Substance Use Topics  . Smoking status: Former Smoker    Types: Cigarettes    Quit date: 03/05/2012  . Smokeless tobacco: Former Neurosurgeon    Quit date: 03/03/2011  . Alcohol Use: No    Allergies:  Allergies  Allergen Reactions  . Ibuprofen Hives and Swelling    Patient states she can take generic Ibuprofen with no reaction. States Advil caused swelling/hives.    Prescriptions prior to admission  Medication Sig Dispense Refill  . loratadine (CLARITIN) 10 MG tablet Take 10 mg by mouth daily as needed for allergies.       . Prenatal Vit-Fe Fumarate-FA (PRENATAL MULTIVITAMIN) TABS Take 1 tablet by mouth daily.         Review of Systems  Constitutional: Negative for fever and chills.  Respiratory: Positive for shortness of breath. Negative for cough, sputum production and wheezing.   Cardiovascular: Negative for chest pain, palpitations and  claudication.  Gastrointestinal: Positive for abdominal pain. Negative for nausea, vomiting, diarrhea and constipation.       Patient is having lower abdominal pain with cramping/contractions.    Genitourinary: Positive for frequency. Negative for dysuria and urgency.  Skin: Negative for rash.  Neurological: Negative for dizziness, tingling, weakness and headaches.   Physical Exam   Blood pressure 130/67, pulse 92, temperature 98.9 F (37.2 C), temperature source Oral, resp. rate 20, height 5\' 1"  (1.549 m), weight 101.152 kg (223 lb), last menstrual period 01/31/2012.  Physical Exam  Constitutional: She is oriented to person, place, and time. She appears well-developed and well-nourished. No distress.  Cardiovascular: Normal rate, regular rhythm and normal heart sounds.  Exam reveals no gallop and no friction rub.   No murmur heard. GI: She exhibits distension. There is tenderness.  Gravid, normoactive bowel sounds x4, tenderness to palpation in lower abdominal.    Genitourinary:  Normal external genitalia, normal vagina, no visible blood. Clear/yellow mucous discharge at cervix. Fern negative. No pooling. 2/50/-2  Musculoskeletal: Normal range of motion. She exhibits no edema and no tenderness.  Neurological: She is alert and oriented to person, place, and time.  Skin: Skin is warm and dry.    MAU Course  Procedures  FHTs:  Baseline: 135  Variability:  Moderate   Accelerations:  present Decelerations:  absent TOCO:   q 3-4 minutes for a short time, then irregular.    Anna Genre 12/05/2012, 12:17 PM   Assessment and Plan  22 y.o. G2P0010 at [redacted]w[redacted]d with bloody show. - FHTs reactive - Cervix 2 cm - may be in early labor but not active and ctx only mild.  Membranes stripped. - F/U with OB as scheduled (tomorrow). - Labor precautions reviewed. - Stable for discharge home.  I saw and examined patient and agree with above student note. I reviewed history, imaging, labs, and  vitals. I personally reviewed the fetal heart tracing, and it is reactive. [name], MD

## 2012-12-05 NOTE — MAU Provider Note (Signed)
Attestation of Attending Supervision of Advanced Practitioner (PA/CNM/NP): Evaluation and management procedures were performed by the Advanced Practitioner under my supervision and collaboration.  I have reviewed the Advanced Practitioner's note and chart, and I agree with the management and plan.  Justyne Roell, MD, FACOG Attending Obstetrician & Gynecologist Faculty Practice, [facility]  

## 2012-12-06 ENCOUNTER — Encounter (HOSPITAL_COMMUNITY): Payer: Self-pay | Admitting: Anesthesiology

## 2012-12-06 ENCOUNTER — Inpatient Hospital Stay (HOSPITAL_COMMUNITY): Payer: Medicaid Other | Admitting: Anesthesiology

## 2012-12-06 ENCOUNTER — Encounter (HOSPITAL_COMMUNITY)
Admission: AD | Disposition: A | Discharge status: Home / Self Care | Payer: Self-pay | Source: Ambulatory Visit | Attending: Obstetrics and Gynecology

## 2012-12-06 ENCOUNTER — Ambulatory Visit (INDEPENDENT_AMBULATORY_CARE_PROVIDER_SITE_OTHER): Payer: Medicaid Other | Admitting: Family Medicine

## 2012-12-06 ENCOUNTER — Inpatient Hospital Stay (HOSPITAL_COMMUNITY)
Admission: AD | Admit: 2012-12-06 | Discharge: 2012-12-09 | DRG: 766 | Disposition: A | Discharge status: Home / Self Care | Payer: Medicaid Other | Source: Ambulatory Visit | Attending: Obstetrics and Gynecology | Admitting: Obstetrics and Gynecology

## 2012-12-06 ENCOUNTER — Encounter (HOSPITAL_COMMUNITY): Payer: Self-pay | Admitting: Obstetrics and Gynecology

## 2012-12-06 VITALS — BP 120/75 | Wt 220.7 lb

## 2012-12-06 DIAGNOSIS — O0930 Supervision of pregnancy with insufficient antenatal care, unspecified trimester: Secondary | ICD-10-CM

## 2012-12-06 DIAGNOSIS — O0933 Supervision of pregnancy with insufficient antenatal care, third trimester: Secondary | ICD-10-CM

## 2012-12-06 LAB — CBC
MCH: 29.1 pg (ref 26.0–34.0)
MCHC: 33.2 g/dL (ref 30.0–36.0)
Platelets: 229 10*3/uL (ref 150–400)
RDW: 12.9 % (ref 11.5–15.5)

## 2012-12-06 LAB — POCT URINALYSIS DIP (DEVICE)
Nitrite: NEGATIVE
Urobilinogen, UA: 1 mg/dL (ref 0.0–1.0)
pH: 7 (ref 5.0–8.0)

## 2012-12-06 LAB — TYPE AND SCREEN: ABO/RH(D): A POS

## 2012-12-06 LAB — RPR: RPR Ser Ql: NONREACTIVE

## 2012-12-06 SURGERY — Surgical Case
Anesthesia: Epidural | Site: Abdomen | Wound class: Clean Contaminated

## 2012-12-06 MED ORDER — LACTATED RINGERS IV SOLN: 500.0000 mL | INTRAVENOUS | Status: DC | PRN

## 2012-12-06 MED ORDER — PRENATAL MULTIVITAMIN CH
1.0000 | ORAL_TABLET | Freq: Every day | ORAL | Status: DC
  Administered 2012-12-07 – 2012-12-08 (×2): 1 via ORAL
  Filled 2012-12-06 (×2): qty 1

## 2012-12-06 MED ORDER — DIBUCAINE 1 % RE OINT: 1.0000 "application " | TOPICAL_OINTMENT | RECTAL | Status: DC | PRN

## 2012-12-06 MED ORDER — NALOXONE HCL 0.4 MG/ML IJ SOLN: 0.4000 mg | INTRAMUSCULAR | Status: DC | PRN

## 2012-12-06 MED ORDER — MEPERIDINE HCL 25 MG/ML IJ SOLN
INTRAMUSCULAR | Status: AC
  Administered 2012-12-06: 6.25 mg via INTRAVENOUS
  Filled 2012-12-06: qty 1

## 2012-12-06 MED ORDER — LIDOCAINE-EPINEPHRINE (PF) 2 %-1:200000 IJ SOLN
INTRAMUSCULAR | Status: AC
  Filled 2012-12-06: qty 20

## 2012-12-06 MED ORDER — OXYTOCIN 40 UNITS IN LACTATED RINGERS INFUSION - SIMPLE MED: 62.5000 mL/h | INTRAVENOUS | Status: DC

## 2012-12-06 MED ORDER — PROMETHAZINE HCL 25 MG/ML IJ SOLN
25.0000 mg | Freq: Once | INTRAMUSCULAR | Status: AC
  Administered 2012-12-06: 25 mg via INTRAVENOUS
  Filled 2012-12-06: qty 1

## 2012-12-06 MED ORDER — SODIUM BICARBONATE 8.4 % IV SOLN
INTRAVENOUS | Status: AC
  Filled 2012-12-06: qty 50

## 2012-12-06 MED ORDER — LANOLIN HYDROUS EX OINT: 1.0000 "application " | TOPICAL_OINTMENT | CUTANEOUS | Status: DC | PRN

## 2012-12-06 MED ORDER — MEASLES, MUMPS & RUBELLA VAC ~~LOC~~ INJ: 0.5000 mL | INJECTION | Freq: Once | SUBCUTANEOUS | Status: DC

## 2012-12-06 MED ORDER — LIDOCAINE HCL (PF) 1 % IJ SOLN
INTRAMUSCULAR | Status: DC | PRN
  Administered 2012-12-06 (×2): 4 mL

## 2012-12-06 MED ORDER — FLEET ENEMA 7-19 GM/118ML RE ENEM: 1.0000 | ENEMA | Freq: Every day | RECTAL | Status: DC | PRN

## 2012-12-06 MED ORDER — ONDANSETRON HCL 4 MG/2ML IJ SOLN: 4.0000 mg | Freq: Four times a day (QID) | INTRAMUSCULAR | Status: DC | PRN

## 2012-12-06 MED ORDER — SENNOSIDES-DOCUSATE SODIUM 8.6-50 MG PO TABS
2.0000 | ORAL_TABLET | Freq: Every day | ORAL | Status: DC
  Administered 2012-12-07 – 2012-12-08 (×2): 2 via ORAL

## 2012-12-06 MED ORDER — DEXTROSE 5 % IV SOLN
INTRAVENOUS | Status: AC
  Filled 2012-12-06: qty 3000

## 2012-12-06 MED ORDER — NALBUPHINE HCL 10 MG/ML IJ SOLN: 5.0000 mg | INTRAMUSCULAR | Status: DC | PRN

## 2012-12-06 MED ORDER — LACTATED RINGERS IV SOLN
500.0000 mL | Freq: Once | INTRAVENOUS | Status: AC
  Administered 2012-12-06: 500 mL via INTRAVENOUS

## 2012-12-06 MED ORDER — MEPERIDINE HCL 25 MG/ML IJ SOLN
6.2500 mg | INTRAMUSCULAR | Status: AC | PRN
Start: 2012-12-06 — End: 2012-12-06
  Administered 2012-12-06: 6.25 mg via INTRAVENOUS

## 2012-12-06 MED ORDER — ONDANSETRON HCL 4 MG PO TABS: 4.0000 mg | ORAL_TABLET | ORAL | Status: DC | PRN

## 2012-12-06 MED ORDER — WITCH HAZEL-GLYCERIN EX PADS: 1.0000 "application " | MEDICATED_PAD | CUTANEOUS | Status: DC | PRN

## 2012-12-06 MED ORDER — MENTHOL 3 MG MT LOZG: 1.0000 | LOZENGE | OROMUCOSAL | Status: DC | PRN

## 2012-12-06 MED ORDER — MORPHINE SULFATE 0.5 MG/ML IJ SOLN
INTRAMUSCULAR | Status: AC
  Filled 2012-12-06: qty 10

## 2012-12-06 MED ORDER — DIPHENHYDRAMINE HCL 50 MG/ML IJ SOLN: 25.0000 mg | INTRAMUSCULAR | Status: DC | PRN

## 2012-12-06 MED ORDER — LACTATED RINGERS IV SOLN: INTRAVENOUS | Status: DC

## 2012-12-06 MED ORDER — PHENYLEPHRINE 40 MCG/ML (10ML) SYRINGE FOR IV PUSH (FOR BLOOD PRESSURE SUPPORT): 80.0000 ug | PREFILLED_SYRINGE | INTRAVENOUS | Status: DC | PRN

## 2012-12-06 MED ORDER — FENTANYL CITRATE 0.05 MG/ML IJ SOLN: 25.0000 ug | INTRAMUSCULAR | Status: DC | PRN

## 2012-12-06 MED ORDER — PHENYLEPHRINE 40 MCG/ML (10ML) SYRINGE FOR IV PUSH (FOR BLOOD PRESSURE SUPPORT)
80.0000 ug | PREFILLED_SYRINGE | INTRAVENOUS | Status: DC | PRN
  Filled 2012-12-06: qty 5

## 2012-12-06 MED ORDER — FENTANYL 2.5 MCG/ML BUPIVACAINE 1/10 % EPIDURAL INFUSION (WH - ANES)
14.0000 mL/h | INTRAMUSCULAR | Status: DC | PRN
  Filled 2012-12-06: qty 125

## 2012-12-06 MED ORDER — SODIUM BICARBONATE 8.4 % IV SOLN
INTRAVENOUS | Status: DC | PRN
  Administered 2012-12-06: 10 mL via EPIDURAL

## 2012-12-06 MED ORDER — 0.9 % SODIUM CHLORIDE (POUR BTL) OPTIME
TOPICAL | Status: DC | PRN
  Administered 2012-12-06: 1000 mL

## 2012-12-06 MED ORDER — OXYTOCIN BOLUS FROM INFUSION: 500.0000 mL | INTRAVENOUS | Status: DC

## 2012-12-06 MED ORDER — OXYTOCIN 10 UNIT/ML IJ SOLN
40.0000 [IU] | INTRAVENOUS | Status: DC | PRN
  Administered 2012-12-06: 40 [IU] via INTRAVENOUS

## 2012-12-06 MED ORDER — FENTANYL 2.5 MCG/ML BUPIVACAINE 1/10 % EPIDURAL INFUSION (WH - ANES)
INTRAMUSCULAR | Status: DC | PRN
  Administered 2012-12-06: 14 mL/h via EPIDURAL

## 2012-12-06 MED ORDER — ONDANSETRON HCL 4 MG/2ML IJ SOLN: 4.0000 mg | Freq: Three times a day (TID) | INTRAMUSCULAR | Status: DC | PRN

## 2012-12-06 MED ORDER — TETANUS-DIPHTH-ACELL PERTUSSIS 5-2.5-18.5 LF-MCG/0.5 IM SUSP: 0.5000 mL | Freq: Once | INTRAMUSCULAR | Status: DC

## 2012-12-06 MED ORDER — ZOLPIDEM TARTRATE 5 MG PO TABS: 5.0000 mg | ORAL_TABLET | Freq: Every evening | ORAL | Status: DC | PRN

## 2012-12-06 MED ORDER — EPHEDRINE 5 MG/ML INJ: 10.0000 mg | INTRAVENOUS | Status: DC | PRN

## 2012-12-06 MED ORDER — ONDANSETRON HCL 4 MG/2ML IJ SOLN
INTRAMUSCULAR | Status: DC | PRN
  Administered 2012-12-06: 4 mg via INTRAVENOUS

## 2012-12-06 MED ORDER — BUTORPHANOL TARTRATE 1 MG/ML IJ SOLN
2.0000 mg | Freq: Once | INTRAMUSCULAR | Status: AC
  Administered 2012-12-06: 2 mg via INTRAVENOUS
  Filled 2012-12-06: qty 2

## 2012-12-06 MED ORDER — ONDANSETRON HCL 4 MG/2ML IJ SOLN: 4.0000 mg | INTRAMUSCULAR | Status: DC | PRN

## 2012-12-06 MED ORDER — ACETAMINOPHEN 325 MG PO TABS: 650.0000 mg | ORAL_TABLET | ORAL | Status: DC | PRN

## 2012-12-06 MED ORDER — EPHEDRINE 5 MG/ML INJ
10.0000 mg | INTRAVENOUS | Status: DC | PRN
  Filled 2012-12-06: qty 4

## 2012-12-06 MED ORDER — SCOPOLAMINE 1 MG/3DAYS TD PT72: 1.0000 | MEDICATED_PATCH | Freq: Once | TRANSDERMAL | Status: DC

## 2012-12-06 MED ORDER — OXYTOCIN 40 UNITS IN LACTATED RINGERS INFUSION - SIMPLE MED: 62.5000 mL/h | INTRAVENOUS | Status: AC

## 2012-12-06 MED ORDER — OXYTOCIN 10 UNIT/ML IJ SOLN
INTRAMUSCULAR | Status: AC
  Filled 2012-12-06: qty 4

## 2012-12-06 MED ORDER — DIPHENHYDRAMINE HCL 50 MG/ML IJ SOLN: 12.5000 mg | INTRAMUSCULAR | Status: DC | PRN

## 2012-12-06 MED ORDER — OXYTOCIN 40 UNITS IN LACTATED RINGERS INFUSION - SIMPLE MED
1.0000 m[IU]/min | INTRAVENOUS | Status: DC
  Administered 2012-12-06: 2 m[IU]/min via INTRAVENOUS
  Administered 2012-12-06: 8 m[IU]/min via INTRAVENOUS
  Filled 2012-12-06: qty 1000

## 2012-12-06 MED ORDER — DIPHENHYDRAMINE HCL 25 MG PO CAPS: 25.0000 mg | ORAL_CAPSULE | Freq: Four times a day (QID) | ORAL | Status: DC | PRN

## 2012-12-06 MED ORDER — MORPHINE SULFATE (PF) 0.5 MG/ML IJ SOLN
INTRAMUSCULAR | Status: DC | PRN
  Administered 2012-12-06: 4 mg via EPIDURAL

## 2012-12-06 MED ORDER — SIMETHICONE 80 MG PO CHEW: 80.0000 mg | CHEWABLE_TABLET | ORAL | Status: DC | PRN

## 2012-12-06 MED ORDER — FLEET ENEMA 7-19 GM/118ML RE ENEM: 1.0000 | ENEMA | RECTAL | Status: DC | PRN

## 2012-12-06 MED ORDER — ONDANSETRON HCL 4 MG/2ML IJ SOLN
INTRAMUSCULAR | Status: AC
  Filled 2012-12-06: qty 2

## 2012-12-06 MED ORDER — CITRIC ACID-SODIUM CITRATE 334-500 MG/5ML PO SOLN
30.0000 mL | ORAL | Status: DC | PRN
  Administered 2012-12-06: 30 mL via ORAL
  Filled 2012-12-06: qty 15

## 2012-12-06 MED ORDER — MORPHINE SULFATE (PF) 0.5 MG/ML IJ SOLN
INTRAMUSCULAR | Status: DC | PRN
  Administered 2012-12-06: 1 mg via INTRAVENOUS

## 2012-12-06 MED ORDER — SIMETHICONE 80 MG PO CHEW
80.0000 mg | CHEWABLE_TABLET | Freq: Three times a day (TID) | ORAL | Status: DC
  Administered 2012-12-07 – 2012-12-09 (×9): 80 mg via ORAL

## 2012-12-06 MED ORDER — DIPHENHYDRAMINE HCL 25 MG PO CAPS
25.0000 mg | ORAL_CAPSULE | ORAL | Status: DC | PRN
  Filled 2012-12-06: qty 1

## 2012-12-06 MED ORDER — SODIUM CHLORIDE 0.9 % IJ SOLN: 3.0000 mL | INTRAMUSCULAR | Status: DC | PRN

## 2012-12-06 MED ORDER — TERBUTALINE SULFATE 1 MG/ML IJ SOLN: 0.2500 mg | Freq: Once | INTRAMUSCULAR | Status: DC | PRN

## 2012-12-06 MED ORDER — SCOPOLAMINE 1 MG/3DAYS TD PT72
MEDICATED_PATCH | TRANSDERMAL | Status: AC
  Administered 2012-12-06: 1.5 mg via TRANSDERMAL
  Filled 2012-12-06: qty 1

## 2012-12-06 MED ORDER — DEXTROSE 5 % IV SOLN
3.0000 g | INTRAVENOUS | Status: DC | PRN
  Administered 2012-12-06: 3 g via INTRAVENOUS

## 2012-12-06 MED ORDER — OXYCODONE-ACETAMINOPHEN 5-325 MG PO TABS
1.0000 | ORAL_TABLET | ORAL | Status: DC | PRN
  Administered 2012-12-07 (×4): 2 via ORAL
  Administered 2012-12-08: 1 via ORAL
  Administered 2012-12-08 – 2012-12-09 (×7): 2 via ORAL
  Administered 2012-12-09: 1 via ORAL
  Filled 2012-12-06 (×11): qty 2
  Filled 2012-12-06: qty 1
  Filled 2012-12-06: qty 2

## 2012-12-06 MED ORDER — METOCLOPRAMIDE HCL 5 MG/ML IJ SOLN: 10.0000 mg | Freq: Three times a day (TID) | INTRAMUSCULAR | Status: DC | PRN

## 2012-12-06 MED ORDER — NALOXONE HCL 1 MG/ML IJ SOLN: 1.0000 ug/kg/h | INTRAVENOUS | Status: DC | PRN

## 2012-12-06 MED ORDER — FERROUS SULFATE 325 (65 FE) MG PO TABS
325.0000 mg | ORAL_TABLET | Freq: Two times a day (BID) | ORAL | Status: DC
  Administered 2012-12-07 – 2012-12-09 (×5): 325 mg via ORAL
  Filled 2012-12-06 (×5): qty 1

## 2012-12-06 MED ORDER — LIDOCAINE HCL (PF) 1 % IJ SOLN: 30.0000 mL | INTRAMUSCULAR | Status: DC | PRN

## 2012-12-06 MED ORDER — BISACODYL 10 MG RE SUPP: 10.0000 mg | Freq: Every day | RECTAL | Status: DC | PRN

## 2012-12-06 MED ORDER — LACTATED RINGERS IV SOLN
INTRAVENOUS | Status: DC
  Administered 2012-12-06 (×2): via INTRAVENOUS

## 2012-12-06 SURGICAL SUPPLY — 27 items
BENZOIN TINCTURE PRP APPL 2/3 (GAUZE/BANDAGES/DRESSINGS) ×4 IMPLANT
CONTAINER PREFILL 10% NBF 15ML (MISCELLANEOUS) IMPLANT
DRAPE LG THREE QUARTER DISP (DRAPES) ×2 IMPLANT
DRSG OPSITE POSTOP 4X10 (GAUZE/BANDAGES/DRESSINGS) ×2 IMPLANT
DURAPREP 26ML APPLICATOR (WOUND CARE) IMPLANT
ELECT REM PT RETURN 9FT ADLT (ELECTROSURGICAL) ×2
ELECTRODE REM PT RTRN 9FT ADLT (ELECTROSURGICAL) ×1 IMPLANT
EXTRACTOR VACUUM M CUP 4 TUBE (SUCTIONS) IMPLANT
GLOVE BIOGEL PI IND STRL 6.5 (GLOVE) ×1 IMPLANT
GLOVE BIOGEL PI INDICATOR 6.5 (GLOVE) ×1
GLOVE SURG SS PI 6.0 STRL IVOR (GLOVE) ×2 IMPLANT
GOWN STRL REIN XL XLG (GOWN DISPOSABLE) ×4 IMPLANT
KIT ABG SYR 3ML LUER SLIP (SYRINGE) ×2 IMPLANT
NEEDLE HYPO 25X5/8 SAFETYGLIDE (NEEDLE) ×2 IMPLANT
NS IRRIG 1000ML POUR BTL (IV SOLUTION) ×2 IMPLANT
PACK C SECTION WH (CUSTOM PROCEDURE TRAY) ×2 IMPLANT
PAD OB MATERNITY 4.3X12.25 (PERSONAL CARE ITEMS) ×2 IMPLANT
RTRCTR C-SECT PINK 25CM LRG (MISCELLANEOUS) IMPLANT
SEPRAFILM MEMBRANE 5X6 (MISCELLANEOUS) IMPLANT
STAPLER VISISTAT 35W (STAPLE) ×2 IMPLANT
STRIP CLOSURE SKIN 1/2X4 (GAUZE/BANDAGES/DRESSINGS) ×4 IMPLANT
SUT PLAIN 0 NONE (SUTURE) IMPLANT
SUT VIC AB 0 CT1 36 (SUTURE) ×8 IMPLANT
SUT VIC AB 4-0 KS 27 (SUTURE) ×2 IMPLANT
TOWEL OR 17X24 6PK STRL BLUE (TOWEL DISPOSABLE) ×6 IMPLANT
TRAY FOLEY CATH 14FR (SET/KITS/TRAYS/PACK) ×2 IMPLANT
WATER STERILE IRR 1000ML POUR (IV SOLUTION) ×2 IMPLANT

## 2012-12-06 NOTE — Op Note (Signed)
Schylar Christenbury PROCEDURE DATE: 12/06/2012  PREOPERATIVE DIAGNOSIS: Intrauterine pregnancy at  [redacted]w[redacted]d weeks gestation; cord prolapse  POSTOPERATIVE DIAGNOSIS: The same  PROCEDURE: Primary Low Transverse Cesarean Section  SURGEON:  Catalina Antigua, MD  ASSISTANT:  [name], MD  INDICATIONS: Whitney Yang is a 22 y.o. G2P1011 at [redacted]w[redacted]d here for cesarean section secondary to the indications listed under preoperative diagnosis; please see preoperative note for further details.  Due to the emergent nature of the cesarean section, risks of c-section were only briefly discussed with the patient and verbal consent was obtained.    FINDINGS:  Viable female infant in cephalic presentation.  Apgars 8 and 9.  Clear amniotic fluid.  Intact placenta, three vessel cord, nuchal x 1. Loose. Normal uterus, fallopian tubes and ovaries bilaterally.  ANESTHESIA: Epidural INTRAVENOUS FLUIDS: 800 ml ESTIMATED BLOOD LOSS: 600 ml URINE OUTPUT:  200 ml SPECIMENS: Placenta sent to L&D COMPLICATIONS: None immediate  PROCEDURE IN DETAIL:  The patient was brought to the OR with CNM holding upward pressure on the fetal head intravaginally to keep pressure off the umbilical cord. The patient preoperatively received intravenous antibiotics and had sequential compression devices applied to her lower extremities.  She was then taken to the operating room where epidural anesthesia was dosed up and found to be adequate. She was then placed in a dorsal supine position with a leftward tilt, and prepped and draped in a sterile manner.  A foley catheter was placed into her bladder and attached to constant gravity.  After an adequate timeout was performed, a Pfannenstiel skin incision was made with scalpel and carried through to the underlying layer of fascia. The fascia was incised in the midline, and this incision was extended bilaterally bluntly.  The rectus muscles were separated in the midline bluntly and the peritoneum  was entered bluntly. A bladder blade was inserted. Attention was turned to the lower uterine segment where a low transverse hysterotomy incision was made with a scalpel and extended bilaterally bluntly.  The infant was successfully delivered, the cord was clamped and cut and the infant was handed over to awaiting neonatology team. Uterine massage was then administered, and the placenta delivered intact with a three-vessel cord. The uterus was then cleared of clot and debris.  The hysterotomy was closed with 0 Vicryl in a running locked fashion. Two figure-of-eight stitches were placed with the same suture to obtain hemostasis. The pelvis was irrigated and cleared of all clot and debris. Hemostasis was confirmed on all surfaces.  The peritoneum was reapproximated using 0 Vicryl in running fashion. The fascia was then closed using 0 Vicryl in a running fashion.  The subcutaneous layer was irrigated, and the skin was closed with a 4-0 Vicryl subcuticular stitch. The patient tolerated the procedure well. Sponge, lap, instrument and needle counts were correct x 2.  She was taken to the recovery room in stable condition.   [name], MD

## 2012-12-06 NOTE — H&P (Signed)
Whitney Yang is a 22 y.o. female G2P0010 at [redacted]w[redacted]d presenting with regular contractions. Patient was seen in MAU yesterday and was discharged in latent labor. Patient presented for scheduled prenatal appointment today with persistent contractions. Cervical exam went from 2 cm yesterday to 4-5cm today. Patient sent from office for admission in active labor. Prenatal care has been uncomplicated at [facility] clinic. She had late onset of care at 27 weeks. She reports good fetal movement and denies leakage of fluid.  Maternal Medical History:  Reason for admission: Contractions.     OB History   Grav Para Term Preterm Abortions TAB SAB Ect Mult Living   2    1  1    0     Past Medical History  Diagnosis Date  . Seasonal allergies   . No pertinent past medical history    Past Surgical History  Procedure Laterality Date  . No past surgeries     Family History: family history includes Hypertension in her mother and sister. Social History:  reports that she quit smoking about 9 months ago. Her smoking use included Cigarettes. She smoked 0.00 packs per day. She quit smokeless tobacco use about 21 months ago. She reports that she does not drink alcohol or use illicit drugs.   Prenatal Transfer Tool  Maternal Diabetes: No Genetic Screening: Declined Maternal Ultrasounds/Referrals: Normal Fetal Ultrasounds or other Referrals:  None Maternal Substance Abuse:  No Significant Maternal Medications:  None Significant Maternal Lab Results:  None Other Comments:  None  Review of Systems  All other systems reviewed and are negative.      Last menstrual period 01/31/2012. Exam Physical Exam  GENERAL: Well-developed, well-nourished female in no acute distress.  HEENT: Normocephalic, atraumatic. Sclerae anicteric.  NECK: Supple. Normal thyroid.  LUNGS: Clear to auscultation bilaterally.  HEART: Regular rate and rhythm. ABDOMEN: Soft, gravid, nontender. Cervix: 4-5/90/-2 by Dr.  Shawnie Pons in office 30 minutes ago EXTREMITIES: No cyanosis, clubbing, or edema, 2+ distal pulses.  Prenatal labs: ABO, Rh: A/POS/-- (01/20 0859) Antibody: NEG (01/20 0859) Rubella: 3.10 (01/20 0859) RPR: NON REAC (02/17 1204)  HBsAg: NEGATIVE (01/20 0859)  HIV: NON REACTIVE (02/17 1204)  GBS: Negative (03/19 0000)   Assessment/Plan: 22 yo G1P0010 at [redacted]w[redacted]d in active phase of labor - Pain management prn - anticipate NSVD - if no cervical change in the next 2 hours will start pitocin and AROM   Mihcael Ledee 12/06/2012, 9:54 AM

## 2012-12-06 NOTE — Transfer of Care (Signed)
Immediate Anesthesia Transfer of Care Note  Patient: Whitney Yang  Procedure(s) Performed: Procedure(s): CESAREAN SECTION (N/A)  Patient Location: PACU  Anesthesia Type:Epidural  Level of Consciousness: awake  Airway & Oxygen Therapy: Patient Spontanous Breathing  Post-op Assessment: Report given to PACU RN  Post vital signs: Reviewed and stable  Complications: No apparent anesthesia complications

## 2012-12-06 NOTE — Progress Notes (Signed)
Patient ID: Whitney Yang, female   DOB: March 15, 1991, 22 y.o.   MRN: [id number]  S: Called to room for fetal heart tones in 60s.  O:   Cervix:  6-7/100/0 Nuchal cord hanging down compressed by baby's neck/shoulders. FHTs in 50s/60s  A/P Cord prolapse --- to OR when ready Patient informed of need for c-section.  Orders placed. Verbal consent given for stat emergency c-section. OR called, anesthesia called.  [name], MD

## 2012-12-06 NOTE — Progress Notes (Signed)
Patient ID: Whitney Yang, female   DOB: Dec 30, 1990, 22 y.o.   MRN: [id number]   S:  Pt has epidural now, comfortable.  O:   Filed Vitals:   12/06/12 1506 12/06/12 1507 12/06/12 1511 12/06/12 1531  BP: 118/62  125/60 115/71  Pulse: 95 96 94 85  Temp:      TempSrc:      Resp: 20  18 20   Height:      Weight:      SpO2:  100% 100%     CERV:  6/100/0, bulging bag AROM, thick mec  FHTs: 140, mod variability with some periods of minimal, accels present, occasional early TOCO:  q 2-3 min  A/P 22 y.o. G2P0010 at [redacted]w[redacted]d with Spon labor, now augmented with pitocin. Making slow change. AROM - meconium, FHTs cat II (some periods of minimal variability) Continue pitocin Anticipate SVD  [name], MD

## 2012-12-06 NOTE — Anesthesia Preprocedure Evaluation (Addendum)
Anesthesia Evaluation  Patient identified by MRN, date of birth, ID band Patient awake    Reviewed: Allergy & Precautions, H&P , Patient's Chart, lab work & pertinent test results  Airway Mallampati: III TM Distance: >3 FB Neck ROM: full    Dental no notable dental hx. (+) Teeth Intact   Pulmonary former smoker,  breath sounds clear to auscultation  Pulmonary exam normal       Cardiovascular negative cardio ROS  Rhythm:regular Rate:Normal     Neuro/Psych negative neurological ROS  negative psych ROS   GI/Hepatic negative GI ROS, Neg liver ROS,   Endo/Other  Morbid obesity  Renal/GU negative Renal ROS  negative genitourinary   Musculoskeletal   Abdominal Normal abdominal exam  (+)   Peds  Hematology negative hematology ROS (+)   Anesthesia Other Findings   Reproductive/Obstetrics (+) Pregnancy                           Anesthesia Physical Anesthesia Plan  ASA: III and emergent  Anesthesia Plan: Epidural   Post-op Pain Management:    Induction:   Airway Management Planned:   Additional Equipment:   Intra-op Plan:   Post-operative Plan:   Informed Consent: I have reviewed the patients History and Physical, chart, labs and discussed the procedure including the risks, benefits and alternatives for the proposed anesthesia with the patient or authorized representative who has indicated his/her understanding and acceptance.     Plan Discussed with: Anesthesiologist, CRNA and Surgeon  Anesthesia Plan Comments:        Anesthesia Quick Evaluation

## 2012-12-06 NOTE — Progress Notes (Signed)
Looks to be in labor--will admit

## 2012-12-06 NOTE — Progress Notes (Signed)
Pulse: 96 Patient doesn't need NST she was seen in MAU yesterday.

## 2012-12-06 NOTE — Anesthesia Procedure Notes (Signed)
Epidural Patient location during procedure: OB Start time: 12/06/2012 2:35 PM  Staffing Anesthesiologist: Justice Aguirre A. Performed by: anesthesiologist   Preanesthetic Checklist Completed: patient identified, site marked, surgical consent, pre-op evaluation, timeout performed, IV checked, risks and benefits discussed and monitors and equipment checked  Epidural Patient position: sitting Prep: site prepped and draped and DuraPrep Patient monitoring: continuous pulse ox and blood pressure Approach: midline Injection technique: LOR air  Needle:  Needle type: Tuohy  Needle gauge: 17 G Needle length: 9 cm and 9 Needle insertion depth: 8 cm Catheter type: closed end flexible Catheter size: 19 Gauge Catheter at skin depth: 13 cm Test dose: negative and Other  Assessment Events: blood not aspirated, injection not painful, no injection resistance, negative IV test and no paresthesia  Additional Notes Patient identified. Risks and benefits discussed including failed block, incomplete  Pain control, post dural puncture headache, nerve damage, paralysis, blood pressure Changes, nausea, vomiting, reactions to medications-both toxic and allergic and post Partum back pain. All questions were answered. Patient expressed understanding and wished to proceed. Sterile technique was used throughout procedure. Epidural site was Dressed with sterile barrier dressing. No paresthesias, signs of intravascular injection Or signs of intrathecal spread were encountered.  Patient was more comfortable after the epidural was dosed. Please see RN's note for documentation of vital signs and FHR which are stable.

## 2012-12-06 NOTE — Progress Notes (Signed)
sve performed, large amount of fluid noted, light mec with small amount of odor noted. Felt umbilical cord on patient's left side but was far up and not fully compressed by head.  Pt turned to right side to try to prevent compression.  FHT's decel down to 60's Dr Candiss Norse requested at bedside stat.  Anesthesia notified of potential c/s at 1707 and OR updated. 1705 Dr Candiss Norse in room, SVE performed cord felt high up on patient's left side.  Dr Jolayne Panther requested and stat c/s called. CHG prep, pubic area clipped, betadine swab to nose at 1709, , sodium citrate given at 1709. 1710 Dr Constant in room. To OR at 92

## 2012-12-06 NOTE — Patient Instructions (Signed)
Breastfeeding Deciding to breastfeed is one of the best choices you can make for you and your baby. The information that follows gives a brief overview of the benefits of breastfeeding as well as common topics surrounding breastfeeding. BENEFITS OF BREASTFEEDING For the baby  The first milk (colostrum) helps the baby's digestive system function better.   There are antibodies in the mother's milk that help the baby fight off infections.   The baby has a lower incidence of asthma, allergies, and sudden infant death syndrome (SIDS).   The nutrients in breast milk are better for the baby than infant formulas, and breast milk helps the baby's brain grow better.   Babies who breastfeed have less gas, colic, and constipation.  For the mother  Breastfeeding helps develop a very special bond between the mother and her baby.   Breastfeeding is convenient, always available at the correct temperature, and costs nothing.   Breastfeeding burns calories in the mother and helps her lose weight that was gained during pregnancy.   Breastfeeding makes the uterus contract back down to normal size faster and slows bleeding following delivery.   Breastfeeding mothers have a lower risk of developing breast cancer.  BREASTFEEDING FREQUENCY  A healthy, full-term baby may breastfeed as often as every hour or space his or her feedings to every 3 hours.   Watch your baby for signs of hunger. Nurse your baby if he or she shows signs of hunger. How often you nurse will vary from baby to baby.   Nurse as often as the baby requests, or when you feel the need to reduce the fullness of your breasts.   Awaken the baby if it has been 3 4 hours since the last feeding.   Frequent feeding will help the mother make more milk and will help prevent problems, such as sore nipples and engorgement of the breasts.  BABY'S POSITION AT THE BREAST  Whether lying down or sitting, be sure that the baby's tummy is  facing your tummy.   Support the breast with 4 fingers underneath the breast and the thumb above. Make sure your fingers are well away from the nipple and baby's mouth.   Stroke the baby's lips gently with your finger or nipple.   When the baby's mouth is open wide enough, place all of your nipple and as much of the areola as possible into your baby's mouth.   Pull the baby in close so the tip of the nose and the baby's cheeks touch the breast during the feeding.  FEEDINGS AND SUCTION  The length of each feeding varies from baby to baby and from feeding to feeding.   The baby must suck about 2 3 minutes for your milk to get to him or her. This is called a "let down." For this reason, allow the baby to feed on each breast as long as he or she wants. Your baby will end the feeding when he or she has received the right balance of nutrients.   To break the suction, put your finger into the corner of the baby's mouth and slide it between his or her gums before removing your breast from his or her mouth. This will help prevent sore nipples.  HOW TO TELL WHETHER YOUR BABY IS GETTING ENOUGH BREAST MILK. Wondering whether or not your baby is getting enough milk is a common concern among mothers. You can be assured that your baby is getting enough milk if:   Your baby is actively   sucking and you hear swallowing.   Your baby seems relaxed and satisfied after a feeding.   Your baby nurses at least 8 12 times in a 24 hour time period. Nurse your baby until he or she unlatches or falls asleep at the first breast (at least 10 20 minutes), then offer the second side.   Your baby is wetting 5 6 disposable diapers (6 8 cloth diapers) in a 24 hour period by 5 6 days of age.   Your baby is having at least 3 4 stools every 24 hours for the first 6 weeks. The stool should be soft and yellow.   Your baby should gain 4 7 ounces per week after he or she is 4 days old.   Your breasts feel softer  after nursing.  REDUCING BREAST ENGORGEMENT  In the first week after your baby is born, you may experience signs of breast engorgement. When breasts are engorged, they feel heavy, warm, full, and may be tender to the touch. You can reduce engorgement if you:   Nurse frequently, every 2 3 hours. Mothers who breastfeed early and often have fewer problems with engorgement.   Place light ice packs on your breasts for 10 20 minutes between feedings. This reduces swelling. Wrap the ice packs in a lightweight towel to protect your skin. Bags of frozen vegetables work well for this purpose.   Take a warm shower or apply warm, moist heat to your breast for 5 10 minutes just before each feeding. This increases circulation and helps the milk flow.   Gently massage your breast before and during the feeding. Using your finger tips, massage from the chest wall towards your nipple in a circular motion.   Make sure that the baby empties at least one breast at every feeding before switching sides.   Use a breast pump to empty the breasts if your baby is sleepy or not nursing well. You may also want to pump if you are returning to work oryou feel you are getting engorged.   Avoid bottle feeds, pacifiers, or supplemental feedings of water or juice in place of breastfeeding. Breast milk is all the food your baby needs. It is not necessary for your baby to have water or formula. In fact, to help your breasts make more milk, it is best not to give your baby supplemental feedings during the early weeks.   Be sure the baby is latched on and positioned properly while breastfeeding.   Wear a supportive bra, avoiding underwire styles.   Eat a balanced diet with enough fluids.   Rest often, relax, and take your prenatal vitamins to prevent fatigue, stress, and anemia.  If you follow these suggestions, your engorgement should improve in 24 48 hours. If you are still experiencing difficulty, call your  lactation consultant or caregiver.  CARING FOR YOURSELF Take care of your breasts  Bathe or shower daily.   Avoid using soap on your nipples.   Start feedings on your left breast at one feeding and on your right breast at the next feeding.   You will notice an increase in your milk supply 2 5 days after delivery. You may feel some discomfort from engorgement, which makes your breasts very firm and often tender. Engorgement "peaks" out within 24 48 hours. In the meantime, apply warm moist towels to your breasts for 5 10 minutes before feeding. Gentle massage and expression of some milk before feeding will soften your breasts, making it easier for your   baby to latch on.   Wear a well-fitting nursing bra, and air dry your nipples for a 3 4minutes after each feeding.   Only use cotton bra pads.   Only use pure lanolin on your nipples after nursing. You do not need to wash it off before feeding the baby again. Another option is to express a few drops of breast milk and gently massage it into your nipples.  Take care of yourself  Eat well-balanced meals and nutritious snacks.   Drinking milk, fruit juice, and water to satisfy your thirst (about 8 glasses a day).   Get plenty of rest.  Avoid foods that you notice affect the baby in a bad way.  SEEK MEDICAL CARE IF:   You have difficulty with breastfeeding and need help.   You have a hard, red, sore area on your breast that is accompanied by a fever.   Your baby is too sleepy to eat well or is having trouble sleeping.   Your baby is wetting less than 6 diapers a day, by 5 days of age.   Your baby's skin or white part of his or her eyes is more yellow than it was in the hospital.   You feel depressed.  Document Released: 07/11/2005 Document Revised: 01/10/2012 Document Reviewed: 10/09/2011 [employer] Patient Information 2013 [employer].  

## 2012-12-06 NOTE — Anesthesia Postprocedure Evaluation (Signed)
  Anesthesia Post-op Note  Patient: Whitney Yang  Procedure(s) Performed: Procedure(s): CESAREAN SECTION (N/A)  Patient Location: PACU  Anesthesia Type:Epidural  Level of Consciousness: awake, alert  and oriented  Airway and Oxygen Therapy: Patient Spontanous Breathing  Post-op Pain: none  Post-op Assessment: Post-op Vital signs reviewed, Patient's Cardiovascular Status Stable, Respiratory Function Stable, Patent Airway, No signs of Nausea or vomiting and Pain level controlled  Post-op Vital Signs: Reviewed and stable  Complications: No apparent anesthesia complications

## 2012-12-06 NOTE — OR Nursing (Signed)
No consent stat c/s

## 2012-12-07 ENCOUNTER — Encounter (HOSPITAL_COMMUNITY): Payer: Self-pay | Admitting: *Deleted

## 2012-12-07 LAB — CBC
HCT: 28.2 % — ABNORMAL LOW (ref 36.0–46.0)
MCH: 29.4 pg (ref 26.0–34.0)
MCV: 88.1 fL (ref 78.0–100.0)
Platelets: 189 10*3/uL (ref 150–400)
RBC: 3.2 MIL/uL — ABNORMAL LOW (ref 3.87–5.11)
WBC: 12 10*3/uL — ABNORMAL HIGH (ref 4.0–10.5)

## 2012-12-07 NOTE — Op Note (Signed)
Attestation of Attending Supervision of Advanced Practitioner (CNM/NP): Evaluation and management procedures were performed by the Advanced Practitioner under my supervision and collaboration.  I have reviewed the Advanced Practitioner's note and chart, and I agree with the management and plan.  Taquilla Downum 12/07/2012 6:59 AM   

## 2012-12-07 NOTE — MAU Provider Note (Signed)
Attestation of Attending Supervision of Advanced Practitioner (CNM/NP): Evaluation and management procedures were performed by the Advanced Practitioner under my supervision and collaboration.  I have reviewed the Advanced Practitioner's note and chart, and I agree with the management and plan.  Juleon Narang 12/07/2012 6:59 AM   

## 2012-12-07 NOTE — Anesthesia Postprocedure Evaluation (Signed)
  Anesthesia Post-op Note  Patient: Whitney Yang  Procedure(s) Performed: Procedure(s): CESAREAN SECTION (N/A)  Patient Location: PACU and Mother/Baby  Anesthesia Type:Epidural  Level of Consciousness: awake, alert  and oriented  Airway and Oxygen Therapy: Patient Spontanous Breathing  Post-op Pain: mild  Post-op Assessment: Patient's Cardiovascular Status Stable, Respiratory Function Stable, No signs of Nausea or vomiting, Adequate PO intake, Pain level controlled, No headache, No backache, No residual numbness and No residual motor weakness  Post-op Vital Signs: stable  Complications: No apparent anesthesia complications

## 2012-12-07 NOTE — Progress Notes (Signed)
UR completed 

## 2012-12-07 NOTE — Progress Notes (Signed)
Post-Op Day 1, primary emergency LTCS for cord prolapse  Subjective: No complaints, up ad lib, voiding and tolerating PO, small lochia,.plans to breastfeed, Depo-Provera  Objective: Blood pressure 118/70, pulse 98, temperature 99.3 F (37.4 C), temperature source Oral, resp. rate 18, height 5\' 1"  (1.549 m), weight 220 lb 11.2 oz (100.109 kg), last menstrual period 01/31/2012, SpO2 100.00%, unknown if currently breastfeeding.  Physical Exam:  General: alert, cooperative and no distress Lochia:normal flow Chest: CTAB Heart: RRR no m/r/g Abdomen: +BS, soft, nontender,  Uterine Fundus: firm DVT Evaluation: No evidence of DVT seen on physical exam. Extremities: trace lower extremity edema   Recent Labs  12/06/12 1000 12/07/12 0605  HGB 12.1 9.4*  HCT 36.5 28.2*    Assessment/Plan: Lactation consult   LOS: 1 day   CRESENZO-DISHMAN,Jashay Roddy 12/07/2012, 7:11 AM

## 2012-12-08 MED ORDER — TETANUS-DIPHTH-ACELL PERTUSSIS 5-2.5-18.5 LF-MCG/0.5 IM SUSP: 0.5000 mL | Freq: Once | INTRAMUSCULAR | Status: DC

## 2012-12-08 NOTE — Progress Notes (Signed)
Subjective: Postpartum Day #2: Cesarean Delivery Patient reports tolerating PO.  Breast and bottle feeding; considering Depo or OCPs for contraception  Objective: Vital signs in last 24 hours: Temp:  [98.9 F (37.2 C)-99.4 F (37.4 C)] 99.3 F (37.4 C) (05/17 0511) Pulse Rate:  [86-102] 99 (05/17 0511) Resp:  [18-20] 20 (05/17 0511) BP: (105-126)/(71-79) 126/79 mmHg (05/17 0511) SpO2:  [97 %-100 %] 97 % (05/16 1520)  Physical Exam:  General: alert, cooperative and mild distress Lochia: appropriate Uterine Fundus: firm Incision: dsg CDI DVT Evaluation: No evidence of DVT seen on physical exam.   Recent Labs  12/06/12 1000 12/07/12 0605  HGB 12.1 9.4*  HCT 36.5 28.2*    Assessment/Plan: Status post Cesarean section. Doing well postoperatively.  Continue current care.  Cam Hai 12/08/2012, 8:46 AM

## 2012-12-09 MED ORDER — FERROUS SULFATE 325 (65 FE) MG PO TABS: 325.0000 mg | ORAL_TABLET | Freq: Two times a day (BID) | ORAL | Status: DC

## 2012-12-09 MED ORDER — OXYCODONE-ACETAMINOPHEN 5-325 MG PO TABS: 1.0000 | ORAL_TABLET | Freq: Four times a day (QID) | ORAL | Status: DC | PRN

## 2012-12-09 MED ORDER — LORATADINE 10 MG PO TABS: 10.0000 mg | ORAL_TABLET | Freq: Every day | ORAL | Status: DC

## 2012-12-09 MED ORDER — OXYCODONE-ACETAMINOPHEN 5-325 MG PO TABS: 1.0000 | ORAL_TABLET | ORAL | Status: DC | PRN

## 2012-12-09 NOTE — Discharge Summary (Signed)
Obstetric Discharge Summary Reason for Admission: onset of labor Prenatal Procedures: none Intrapartum Procedures: cesarean: low cervical, transverse d/t cord prolapse Postpartum Procedures: none Complications-Operative and Postpartum: none Eating, drinking, voiding, ambulating well.  +flatus.  Lochia and pain wnl.  Denies dizziness, lightheadedness, sob. No complaints.   Hemoglobin  Date Value Range Status  12/07/2012 9.4* 12.0 - 15.0 g/dL Final     DELTA CHECK NOTED     REPEATED TO VERIFY     HCT  Date Value Range Status  12/07/2012 28.2* 36.0 - 46.0 % Final   Hospital Course: Stepanie Muzio is a 22 y.o. female G2P0010 at [redacted]w[redacted]d who presented with regular contractions. Patient was seen in MAU day before admission and was discharged in latent labor. Patient presented for scheduled prenatal appointment on day of admission with persistent contractions. Cervical exam went from 2 cm day before admission to 4-5cm . Patient was sent from office for admission in active labor. Labor was augmented w/ arom, and a short time later baby had decel to 60's and there was noted to be a prolapsed cord in the vagina- pt was taken back for stat PLTCS, apgars were 8/9, and postpartum course has been uneventful.  Physical Exam:  General: alert, cooperative and no distress Lochia: appropriate Uterine Fundus: firm Incision: healing well, no significant drainage, no dehiscence, no significant erythema- outer dressing removed, honeycomb dressing still intact DVT Evaluation: No evidence of DVT seen on physical exam. Negative Homan's sign. No cords or calf tenderness. No significant calf/ankle edema.  Discharge Diagnoses: Term Pregnancy-delivered  Discharge Information: Date: 12/09/2012 Activity: pelvic rest Diet: routine Medications: PNV, Iron and Percocet Condition: stable Instructions: refer to practice specific booklet Discharge to: home Follow-up Information   Follow up with [facility]. Schedule an appointment as soon as possible for a visit in 6 weeks. (for postpartum visit)    Contact information:   [address] [place] [address] [phone]      Newborn Data: Live born female  Birth Weight: 7 lb 1.4 oz (3215 g) APGAR: 8, 9  Home with mother. Breastfeeding, thinking about micronor for contraception, OP circumcision- info given Baby on pillow on bed unattended while mom in br- I placed infant in bassinet and educated pt about not leaving infant somewhere where he could fall, to place safely in bassinet/crib, etc. She verbalized understanding.  Notified RN. Requested rx for breast pump, so this was given to pt Needs SW consult prior to d/c for late onset care @ 27wks.   Marge Duncans 12/09/2012, 7:32 AM

## 2012-12-09 NOTE — Discharge Summary (Signed)
Attestation of Attending Supervision of Advanced Practitioner (CNM/NP): Evaluation and management procedures were performed by the Advanced Practitioner under my supervision and collaboration.  I have reviewed the Advanced Practitioner's note and chart, and I agree with the management and plan.  HARRAWAY-SMITH, Meade Hogeland 8:49 AM     

## 2012-12-09 NOTE — Progress Notes (Signed)
SW usually consults if LPNC is after 28 weeks. CSW reviewed chart.  No barriers to discharge at this time.  Please reconsult if further needs arise.    [phone] 

## 2012-12-09 NOTE — Clinical Social Work Note (Signed)
CSW spoke with RN and spoke to [facility] about possible anxiety/depression concerns.  MOB reports she has no hx and no current concerns.  She reports this is her first baby and she is excited about being a new mom.  MOB knows to let RN or CSW know if any concerns arise.  [phone]

## 2012-12-13 ENCOUNTER — Ambulatory Visit (INDEPENDENT_AMBULATORY_CARE_PROVIDER_SITE_OTHER): Payer: Medicaid Other | Admitting: *Deleted

## 2012-12-13 ENCOUNTER — Inpatient Hospital Stay (HOSPITAL_COMMUNITY): Admit: 2012-12-13 | Payer: Medicaid Other

## 2012-12-13 VITALS — BP 120/80 | HR 94 | Temp 98.8°F | Ht 63.0 in | Wt 206.0 lb

## 2012-12-13 DIAGNOSIS — Z5189 Encounter for other specified aftercare: Secondary | ICD-10-CM

## 2012-12-13 DIAGNOSIS — Z09 Encounter for follow-up examination after completed treatment for conditions other than malignant neoplasm: Secondary | ICD-10-CM

## 2012-12-13 MED ORDER — OXYCODONE-ACETAMINOPHEN 5-325 MG PO TABS: 1.0000 | ORAL_TABLET | ORAL | Status: DC | PRN

## 2012-12-13 NOTE — Progress Notes (Signed)
Per report patient here for staple removal, but when incision assessed- no staples present. Incision intact with multitude of steristips- Dr. Jolayne Panther in to examine inciision and steristrips removed- incision clean, dry, intact without redness or edema.  Patient reports she lost her pain medicine and can't take ibuprofen. Dr. Jolayne Panther gave her a refill of her percocet.  Instructed patient to make 6 week check up appointment and to continue to shower daily , keep incision clean, dry, intact- and observe.  If incision opens, or has discharge, redness, edema to go to MAU and continue to not lift anything heavier than her baby.  Patient voices understanding.

## 2012-12-13 NOTE — Progress Notes (Signed)
Patient ID: Whitney Yang, female   DOB: 1991/05/09, 22 y.o.   MRN: [id number] Patient s/p PLTCS on 5/15 here for wound check. Patient is still in pain and has lost her percocet prescription as it fail down the drain.  GENERAL: Well-developed, well-nourished female in no acute distress.  ABDOMEN: Soft, nontender, nondistended. No organomegaly.  Incision: no erythema, induration or drainage. Steri-strips removed. Incision healing well. PELVIC: Normal external female genitalia. Vagina is pink and rugated.  Normal discharge. Normal appearing cervix. Uterus is normal in size. No adnexal mass or tenderness. EXTREMITIES: No cyanosis, clubbing, or edema, 2+ distal pulses.   A/P 22 yo POD#7 s/p c-section  - Incision healing well - Rx percocet provided - RTC for 6 week postpartum visit

## 2013-01-10 ENCOUNTER — Ambulatory Visit (INDEPENDENT_AMBULATORY_CARE_PROVIDER_SITE_OTHER): Payer: Medicaid Other | Admitting: Obstetrics and Gynecology

## 2013-01-10 ENCOUNTER — Encounter: Payer: Self-pay | Admitting: Obstetrics and Gynecology

## 2013-01-10 VITALS — BP 128/80 | HR 83 | Temp 97.3°F | Ht 63.0 in | Wt 197.0 lb

## 2013-01-10 DIAGNOSIS — Z309 Encounter for contraceptive management, unspecified: Secondary | ICD-10-CM

## 2013-01-10 MED ORDER — NORETHINDRONE 0.35 MG PO TABS: 1.0000 | ORAL_TABLET | Freq: Every day | ORAL | Status: DC

## 2013-01-10 NOTE — Progress Notes (Signed)
  Subjective:     Whitney Yang is a 22 y.o. female G2P1011 who presents for a postpartum visit. She is 5 weeks postpartum following a low cervical transverse Cesarean section. I have fully reviewed the prenatal and intrapartum course. The delivery was at 40.3 gestational weeks. Outcome: primary cesarean section, low transverse incision. Cord prolapse with FHR deceleration after AROM at 4-5 cm. Anesthesia: epidural. Postpartum course has been uncomplicated. Baby's course has been uncomplicated.Pecola Leisure is feeding by breast primarily but has supplemented at night at times. Bleeding stopped completely 2-3 weeks ago and began having menstrual bleeding 2 days ago.. Bowel function is normal. Bladder function is normal. Patient is not sexually active. Contraception method is abstinence. Postpartum depression screening: negative. Hgb postoperatively 9.4 Taking PNV. She is concerned that there is an odor from her wound. The following portions of the patient's history were reviewed and updated as appropriate: allergies, current medications, past family history, past medical history, past social history, past surgical history and problem list.  Review of Systems Pertinent items are noted in HPI.   Objective:    There were no vitals taken for this visit.  General:  alert and no distress   Breasts:  inspection negative, no nipple discharge or bleeding, no masses or nodularity palpable and lactational  Lungs: clear to auscultation bilaterally  Heart:  regular rate and rhythm, S1, S2 normal, no murmur, click, rub or gallop  Abdomen: obese soft nontender. Small diastasis. Incision is well-healed except there is about a 1 cm area right of midline and also left of midline that has overriding scan and is not well approximated. There is no purulent drainage no redness, minimal induration. Nontender   Vulva:  normal  Vagina: not evaluated  Cervix:  no cervical motion tenderness  Corpus: normal size, contour,  position, consistency, mobility, non-tender  Adnexa:  normal adnexa  Rectal Exam: Not performed.        Assessment:     5 wks  postpartum exam. Pap smear not done at today's visit.   Plan:    1. Contraception: oral progesterone-only contraceptive prescribed. Counseled that LARC more effective. Will take same time qd. 2. Exercise and diet discussed 3. Follow up in: 7 months or as needed. Pap due Jan 2015

## 2013-01-17 ENCOUNTER — Telehealth: Payer: Self-pay | Admitting: General Practice

## 2013-01-17 NOTE — Telephone Encounter (Signed)
Patient called and left message stating she was seen last week for her 6 week post partum appt and forgot to get paperwork stating she can go back to work and the change in her due dates and would like a call back. Called patient back and she asked if we could email her the information, told patient that we couldn't email it but that she can come by and we can provide her with the notes then. Patient verbalized understanding and had no further questions

## 2013-03-27 ENCOUNTER — Encounter: Payer: Self-pay | Admitting: *Deleted

## 2013-09-23 ENCOUNTER — Other Ambulatory Visit: Payer: Self-pay | Admitting: Obstetrics and Gynecology

## 2013-12-09 ENCOUNTER — Encounter: Payer: Self-pay | Admitting: *Deleted

## 2013-12-17 ENCOUNTER — Inpatient Hospital Stay (HOSPITAL_COMMUNITY)
Admission: AD | Admit: 2013-12-17 | Discharge: 2013-12-17 | Disposition: A | Discharge status: Home / Self Care | Payer: Medicaid Other | Source: Ambulatory Visit | Attending: Obstetrics and Gynecology | Admitting: Obstetrics and Gynecology

## 2013-12-17 ENCOUNTER — Encounter (HOSPITAL_COMMUNITY): Payer: Self-pay | Admitting: *Deleted

## 2013-12-17 ENCOUNTER — Encounter (HOSPITAL_COMMUNITY): Payer: Self-pay

## 2013-12-17 DIAGNOSIS — O479 False labor, unspecified: Secondary | ICD-10-CM | POA: Insufficient documentation

## 2013-12-17 DIAGNOSIS — M545 Low back pain, unspecified: Secondary | ICD-10-CM | POA: Insufficient documentation

## 2013-12-17 DIAGNOSIS — O0930 Supervision of pregnancy with insufficient antenatal care, unspecified trimester: Secondary | ICD-10-CM | POA: Insufficient documentation

## 2013-12-17 LAB — POCT FERN TEST: POCT Fern Test: NEGATIVE

## 2013-12-17 NOTE — Discharge Instructions (Signed)

## 2013-12-17 NOTE — Progress Notes (Signed)
Dr Tenny Craw notified of neg fern, sve, FHR strip reactive. Pt stable for d/c home.

## 2013-12-17 NOTE — Progress Notes (Signed)
Whitney Hoit NP in to do spec exam to r/o srom. No fld seen -white creamy d/c

## 2013-12-17 NOTE — MAU Note (Signed)
Leaking fld for several days. Contractions for several wks but worse the last wk. A lot of lower back pain

## 2013-12-17 NOTE — Progress Notes (Signed)
Dr Tenny Craw notified of pt's admission and status. Aware of ctx pattern, pt report of leaking fld several days. Limited prenatal care. Provider to do spec exam to r/o SROM and then ck cervix

## 2013-12-19 ENCOUNTER — Encounter (HOSPITAL_COMMUNITY): Payer: Self-pay | Admitting: *Deleted

## 2013-12-19 ENCOUNTER — Inpatient Hospital Stay (HOSPITAL_COMMUNITY)
Admission: RE | Admit: 2013-12-19 | Discharge: 2013-12-21 | DRG: 765 | Disposition: A | Discharge status: Home / Self Care | Payer: Medicaid Other | Source: Ambulatory Visit | Attending: Obstetrics and Gynecology | Admitting: Obstetrics and Gynecology

## 2013-12-19 ENCOUNTER — Encounter (HOSPITAL_COMMUNITY): Payer: Medicaid Other | Admitting: Anesthesiology

## 2013-12-19 ENCOUNTER — Encounter (HOSPITAL_COMMUNITY)
Admission: RE | Disposition: A | Discharge status: Home / Self Care | Payer: Self-pay | Source: Ambulatory Visit | Attending: Obstetrics and Gynecology

## 2013-12-19 ENCOUNTER — Inpatient Hospital Stay (HOSPITAL_COMMUNITY): Payer: Medicaid Other | Admitting: Anesthesiology

## 2013-12-19 DIAGNOSIS — O0930 Supervision of pregnancy with insufficient antenatal care, unspecified trimester: Secondary | ICD-10-CM

## 2013-12-19 DIAGNOSIS — Z302 Encounter for sterilization: Secondary | ICD-10-CM

## 2013-12-19 DIAGNOSIS — O99214 Obesity complicating childbirth: Secondary | ICD-10-CM

## 2013-12-19 DIAGNOSIS — Z3480 Encounter for supervision of other normal pregnancy, unspecified trimester: Secondary | ICD-10-CM

## 2013-12-19 DIAGNOSIS — Z6841 Body Mass Index (BMI) 40.0 and over, adult: Secondary | ICD-10-CM

## 2013-12-19 DIAGNOSIS — O99210 Obesity complicating pregnancy, unspecified trimester: Secondary | ICD-10-CM

## 2013-12-19 DIAGNOSIS — Z87891 Personal history of nicotine dependence: Secondary | ICD-10-CM

## 2013-12-19 DIAGNOSIS — O34219 Maternal care for unspecified type scar from previous cesarean delivery: Principal | ICD-10-CM | POA: Diagnosis present

## 2013-12-19 DIAGNOSIS — Z8249 Family history of ischemic heart disease and other diseases of the circulatory system: Secondary | ICD-10-CM

## 2013-12-19 DIAGNOSIS — E669 Obesity, unspecified: Secondary | ICD-10-CM | POA: Diagnosis present

## 2013-12-19 LAB — TYPE AND SCREEN
ABO/RH(D): A POS
ANTIBODY SCREEN: NEGATIVE

## 2013-12-19 LAB — CBC
HEMATOCRIT: 31.9 % — AB (ref 36.0–46.0)
Hemoglobin: 10.3 g/dL — ABNORMAL LOW (ref 12.0–15.0)
MCH: 27.6 pg (ref 26.0–34.0)
MCHC: 32.3 g/dL (ref 30.0–36.0)
MCV: 85.5 fL (ref 78.0–100.0)
Platelets: 228 10*3/uL (ref 150–400)
RBC: 3.73 MIL/uL — ABNORMAL LOW (ref 3.87–5.11)
RDW: 14.1 % (ref 11.5–15.5)
WBC: 7.4 10*3/uL (ref 4.0–10.5)

## 2013-12-19 LAB — HEPATITIS B SURFACE ANTIGEN: HEP B S AG: NEGATIVE

## 2013-12-19 LAB — OB RESULTS CONSOLE HIV ANTIBODY (ROUTINE TESTING): HIV: NONREACTIVE

## 2013-12-19 LAB — RPR

## 2013-12-19 LAB — RAPID HIV SCREEN (WH-MAU): Rapid HIV Screen: NONREACTIVE

## 2013-12-19 SURGERY — Surgical Case
Anesthesia: Spinal | Site: Abdomen

## 2013-12-19 MED ORDER — WITCH HAZEL-GLYCERIN EX PADS: 1.0000 "application " | MEDICATED_PAD | CUTANEOUS | Status: DC | PRN

## 2013-12-19 MED ORDER — DIPHENHYDRAMINE HCL 50 MG/ML IJ SOLN: 12.5000 mg | INTRAMUSCULAR | Status: DC | PRN

## 2013-12-19 MED ORDER — LACTATED RINGERS IV SOLN: INTRAVENOUS | Status: DC

## 2013-12-19 MED ORDER — METOCLOPRAMIDE HCL 5 MG/ML IJ SOLN
INTRAMUSCULAR | Status: AC
  Filled 2013-12-19: qty 2

## 2013-12-19 MED ORDER — PHENYLEPHRINE 8 MG IN D5W 100 ML (0.08MG/ML) PREMIX OPTIME
INJECTION | INTRAVENOUS | Status: AC
  Filled 2013-12-19: qty 100

## 2013-12-19 MED ORDER — FENTANYL CITRATE 0.05 MG/ML IJ SOLN
INTRAMUSCULAR | Status: AC
  Filled 2013-12-19: qty 2

## 2013-12-19 MED ORDER — SCOPOLAMINE 1 MG/3DAYS TD PT72: 1.0000 | MEDICATED_PATCH | Freq: Once | TRANSDERMAL | Status: DC

## 2013-12-19 MED ORDER — OXYTOCIN 10 UNIT/ML IJ SOLN
INTRAMUSCULAR | Status: AC
  Filled 2013-12-19: qty 4

## 2013-12-19 MED ORDER — SENNOSIDES-DOCUSATE SODIUM 8.6-50 MG PO TABS
2.0000 | ORAL_TABLET | ORAL | Status: DC
  Administered 2013-12-20 – 2013-12-21 (×2): 2 via ORAL
  Filled 2013-12-19 (×2): qty 2

## 2013-12-19 MED ORDER — MEASLES, MUMPS & RUBELLA VAC ~~LOC~~ INJ: 0.5000 mL | INJECTION | Freq: Once | SUBCUTANEOUS | Status: DC

## 2013-12-19 MED ORDER — BUPIVACAINE IN DEXTROSE 0.75-8.25 % IT SOLN
INTRATHECAL | Status: DC | PRN
  Administered 2013-12-19: 1.4 mg via INTRATHECAL

## 2013-12-19 MED ORDER — ONDANSETRON HCL 4 MG/2ML IJ SOLN: 4.0000 mg | Freq: Three times a day (TID) | INTRAMUSCULAR | Status: DC | PRN

## 2013-12-19 MED ORDER — LACTATED RINGERS IV SOLN
INTRAVENOUS | Status: DC
  Administered 2013-12-19 (×2): via INTRAVENOUS

## 2013-12-19 MED ORDER — LACTATED RINGERS IV SOLN
Freq: Once | INTRAVENOUS | Status: AC
  Administered 2013-12-19: 11:00:00 via INTRAVENOUS

## 2013-12-19 MED ORDER — MIDAZOLAM HCL 2 MG/2ML IJ SOLN: 0.5000 mg | Freq: Once | INTRAMUSCULAR | Status: DC | PRN

## 2013-12-19 MED ORDER — CEFAZOLIN SODIUM-DEXTROSE 2-3 GM-% IV SOLR
INTRAVENOUS | Status: AC
  Filled 2013-12-19: qty 50

## 2013-12-19 MED ORDER — ACETAMINOPHEN 500 MG PO TABS: 1000.0000 mg | ORAL_TABLET | Freq: Four times a day (QID) | ORAL | Status: AC

## 2013-12-19 MED ORDER — DIBUCAINE 1 % RE OINT: 1.0000 "application " | TOPICAL_OINTMENT | RECTAL | Status: DC | PRN

## 2013-12-19 MED ORDER — OXYTOCIN 40 UNITS IN LACTATED RINGERS INFUSION - SIMPLE MED: 62.5000 mL/h | INTRAVENOUS | Status: AC

## 2013-12-19 MED ORDER — SCOPOLAMINE 1 MG/3DAYS TD PT72
MEDICATED_PATCH | TRANSDERMAL | Status: DC
Start: 2013-12-19 — End: 2013-12-21
  Administered 2013-12-19: 1.5 mg via TRANSDERMAL
  Filled 2013-12-19: qty 1

## 2013-12-19 MED ORDER — LACTATED RINGERS IV SOLN
40.0000 [IU] | INTRAVENOUS | Status: DC | PRN
  Administered 2013-12-19: 40 [IU] via INTRAVENOUS

## 2013-12-19 MED ORDER — CEFAZOLIN SODIUM-DEXTROSE 2-3 GM-% IV SOLR
2.0000 g | Freq: Once | INTRAVENOUS | Status: AC
  Administered 2013-12-19: 2 g via INTRAVENOUS

## 2013-12-19 MED ORDER — PRENATAL MULTIVITAMIN CH
1.0000 | ORAL_TABLET | Freq: Every day | ORAL | Status: DC
  Administered 2013-12-20: 1 via ORAL
  Filled 2013-12-19 (×2): qty 1

## 2013-12-19 MED ORDER — ONDANSETRON HCL 4 MG/2ML IJ SOLN
INTRAMUSCULAR | Status: AC
  Filled 2013-12-19: qty 2

## 2013-12-19 MED ORDER — NALOXONE HCL 0.4 MG/ML IJ SOLN: 0.4000 mg | INTRAMUSCULAR | Status: DC | PRN

## 2013-12-19 MED ORDER — MORPHINE SULFATE 0.5 MG/ML IJ SOLN
INTRAMUSCULAR | Status: AC
  Filled 2013-12-19: qty 10

## 2013-12-19 MED ORDER — MEPERIDINE HCL 25 MG/ML IJ SOLN: 6.2500 mg | INTRAMUSCULAR | Status: DC | PRN

## 2013-12-19 MED ORDER — NALBUPHINE HCL 10 MG/ML IJ SOLN: 5.0000 mg | INTRAMUSCULAR | Status: DC | PRN

## 2013-12-19 MED ORDER — METOCLOPRAMIDE HCL 5 MG/ML IJ SOLN
10.0000 mg | Freq: Three times a day (TID) | INTRAMUSCULAR | Status: DC | PRN
  Administered 2013-12-19 (×2): 10 mg via INTRAVENOUS
  Filled 2013-12-19: qty 2

## 2013-12-19 MED ORDER — DIPHENHYDRAMINE HCL 25 MG PO CAPS
25.0000 mg | ORAL_CAPSULE | ORAL | Status: DC | PRN
  Filled 2013-12-19: qty 1

## 2013-12-19 MED ORDER — DEXTROSE 5 % IV SOLN
1.0000 ug/kg/h | INTRAVENOUS | Status: DC | PRN
  Filled 2013-12-19: qty 2

## 2013-12-19 MED ORDER — SODIUM CHLORIDE 0.9 % IJ SOLN
3.0000 mL | INTRAMUSCULAR | Status: DC | PRN
Start: 2013-12-19 — End: 2013-12-21

## 2013-12-19 MED ORDER — DIPHENHYDRAMINE HCL 50 MG/ML IJ SOLN: 25.0000 mg | INTRAMUSCULAR | Status: DC | PRN

## 2013-12-19 MED ORDER — SIMETHICONE 80 MG PO CHEW
80.0000 mg | CHEWABLE_TABLET | ORAL | Status: DC
Start: 2013-12-20 — End: 2013-12-21
  Administered 2013-12-21: 80 mg via ORAL
  Filled 2013-12-19: qty 1

## 2013-12-19 MED ORDER — ONDANSETRON HCL 4 MG/2ML IJ SOLN
4.0000 mg | INTRAMUSCULAR | Status: DC | PRN
  Administered 2013-12-19: 4 mg via INTRAVENOUS
  Filled 2013-12-19: qty 2

## 2013-12-19 MED ORDER — FENTANYL CITRATE 0.05 MG/ML IJ SOLN: 25.0000 ug | INTRAMUSCULAR | Status: DC | PRN

## 2013-12-19 MED ORDER — ONDANSETRON HCL 4 MG/2ML IJ SOLN
INTRAMUSCULAR | Status: DC | PRN
  Administered 2013-12-19: 4 mg via INTRAVENOUS

## 2013-12-19 MED ORDER — ONDANSETRON HCL 4 MG PO TABS
4.0000 mg | ORAL_TABLET | ORAL | Status: DC | PRN
Start: 2013-12-19 — End: 2013-12-21

## 2013-12-19 MED ORDER — IBUPROFEN 600 MG PO TABS
600.0000 mg | ORAL_TABLET | Freq: Four times a day (QID) | ORAL | Status: DC
  Administered 2013-12-20 – 2013-12-21 (×5): 600 mg via ORAL
  Filled 2013-12-19 (×5): qty 1

## 2013-12-19 MED ORDER — FENTANYL CITRATE 0.05 MG/ML IJ SOLN
INTRAMUSCULAR | Status: DC | PRN
  Administered 2013-12-19: 12.5 ug via INTRATHECAL

## 2013-12-19 MED ORDER — LACTATED RINGERS IV SOLN
INTRAVENOUS | Status: DC | PRN
  Administered 2013-12-19: 12:00:00 via INTRAVENOUS

## 2013-12-19 MED ORDER — ZOLPIDEM TARTRATE 5 MG PO TABS
5.0000 mg | ORAL_TABLET | Freq: Every evening | ORAL | Status: DC | PRN
Start: 2013-12-19 — End: 2013-12-21

## 2013-12-19 MED ORDER — OXYCODONE-ACETAMINOPHEN 5-325 MG PO TABS
1.0000 | ORAL_TABLET | ORAL | Status: DC | PRN
  Administered 2013-12-20 – 2013-12-21 (×3): 1 via ORAL
  Administered 2013-12-21: 2 via ORAL
  Administered 2013-12-21: 1 via ORAL
  Administered 2013-12-21: 2 via ORAL
  Filled 2013-12-19: qty 1
  Filled 2013-12-19 (×2): qty 2
  Filled 2013-12-19: qty 1
  Filled 2013-12-19: qty 2
  Filled 2013-12-19: qty 1

## 2013-12-19 MED ORDER — MORPHINE SULFATE (PF) 0.5 MG/ML IJ SOLN
INTRAMUSCULAR | Status: DC | PRN
  Administered 2013-12-19: 200 ug via INTRATHECAL

## 2013-12-19 MED ORDER — PHENYLEPHRINE HCL 10 MG/ML IJ SOLN
INTRAMUSCULAR | Status: DC | PRN
  Administered 2013-12-19: 60 ug via INTRAVENOUS

## 2013-12-19 MED ORDER — PROMETHAZINE HCL 25 MG/ML IJ SOLN: 6.2500 mg | INTRAMUSCULAR | Status: DC | PRN

## 2013-12-19 MED ORDER — TETANUS-DIPHTH-ACELL PERTUSSIS 5-2.5-18.5 LF-MCG/0.5 IM SUSP: 0.5000 mL | Freq: Once | INTRAMUSCULAR | Status: DC

## 2013-12-19 SURGICAL SUPPLY — 30 items
CLAMP CORD UMBIL (MISCELLANEOUS) IMPLANT
CLIP FILSHIE TUBAL LIGA STRL (Clip) ×3 IMPLANT
CLOTH BEACON ORANGE TIMEOUT ST (SAFETY) ×3 IMPLANT
CONTAINER PREFILL 10% NBF 15ML (MISCELLANEOUS) IMPLANT
DRAPE LG THREE QUARTER DISP (DRAPES) IMPLANT
DRSG OPSITE POSTOP 4X10 (GAUZE/BANDAGES/DRESSINGS) ×3 IMPLANT
DURAPREP 26ML APPLICATOR (WOUND CARE) ×3 IMPLANT
ELECT REM PT RETURN 9FT ADLT (ELECTROSURGICAL) ×3
ELECTRODE REM PT RTRN 9FT ADLT (ELECTROSURGICAL) ×1 IMPLANT
EXTRACTOR VACUUM M CUP 4 TUBE (SUCTIONS) IMPLANT
EXTRACTOR VACUUM M CUP 4' TUBE (SUCTIONS)
GLOVE ECLIPSE 7.0 STRL STRAW (GLOVE) ×6 IMPLANT
GOWN STRL REUS W/TWL LRG LVL3 (GOWN DISPOSABLE) ×6 IMPLANT
KIT ABG SYR 3ML LUER SLIP (SYRINGE) IMPLANT
NEEDLE HYPO 25X5/8 SAFETYGLIDE (NEEDLE) IMPLANT
NS IRRIG 1000ML POUR BTL (IV SOLUTION) ×3 IMPLANT
PACK C SECTION WH (CUSTOM PROCEDURE TRAY) ×3 IMPLANT
PAD OB MATERNITY 4.3X12.25 (PERSONAL CARE ITEMS) ×3 IMPLANT
RETRACTOR WND ALEXIS 25 LRG (MISCELLANEOUS) IMPLANT
RETRACTOR WOUND ALXS 34CM XLRG (MISCELLANEOUS) IMPLANT
RTRCTR WOUND ALEXIS 25CM LRG (MISCELLANEOUS)
RTRCTR WOUND ALEXIS 34CM XLRG (MISCELLANEOUS)
STAPLER VISISTAT 35W (STAPLE) ×3 IMPLANT
SUT MNCRL 0 VIOLET CTX 36 (SUTURE) ×3 IMPLANT
SUT MON AB 2-0 CT1 27 (SUTURE) ×6 IMPLANT
SUT MONOCRYL 0 CTX 36 (SUTURE) ×6
SUT PLAIN 0 NONE (SUTURE) IMPLANT
TOWEL OR 17X24 6PK STRL BLUE (TOWEL DISPOSABLE) ×3 IMPLANT
TRAY FOLEY CATH 14FR (SET/KITS/TRAYS/PACK) IMPLANT
WATER STERILE IRR 1000ML POUR (IV SOLUTION) ×3 IMPLANT

## 2013-12-19 NOTE — Consult Note (Signed)
Neonatology Note:   Attendance at C-section:    I was asked by Dr. Dareen Piano to attend this repeat C/S at term. The mother is a G3P1A1 A pos, GBS neg with obesity and late [facility]. ROM at delivery, fluid clear. Infant vigorous with good spontaneous cry and tone. Needed no suctioning. Ap 9/9. Lungs clear to ausc in DR. Small accessory nipple noted on right chest. To CN to care of Pediatrician.   Doretha Sou, MD

## 2013-12-19 NOTE — H&P (Signed)
Whitney Yang is an 23 y.o. black female G3P1011 [redacted]w[redacted]d female. Who presents to the or for a repeat C/S and BTL. Pt was offered a TOLAC and declined. She understands the risks of BTL and the permanence.  Chief Complaint: HPI:  Past Medical History  Diagnosis Date  . Seasonal allergies   . No pertinent past medical history     Past Surgical History  Procedure Laterality Date  . No past surgeries    . Cesarean section N/A 12/06/2012    Procedure: CESAREAN SECTION;  Surgeon: Catalina Antigua, MD;  Location: [facility];  Service: Obstetrics;  Laterality: N/A;    Family History  Problem Relation Age of Onset  . Hypertension Mother   . Hypertension Sister    Social History:  reports that she quit smoking about 21 months ago. Her smoking use included Cigarettes. She smoked 0.00 packs per day. She quit smokeless tobacco use about 2 years ago. She reports that she does not drink alcohol or use illicit drugs.  Allergies:  Allergies  Allergen Reactions  . Advil [Ibuprofen]     Only to advil, not generic ibuprofen    Medications Prior to Admission  Medication Sig Dispense Refill  . loratadine (CLARITIN) 10 MG tablet Take 1 tablet (10 mg total) by mouth daily.  30 tablet  1  . Prenatal Vit-Fe Fumarate-FA (PRENATAL MULTIVITAMIN) TABS Take 1 tablet by mouth daily.            Blood pressure 130/79, pulse 87, temperature 98.4 F (36.9 C), temperature source Oral, resp. rate 18, SpO2 99.00%, currently breastfeeding. General appearance: alert Lungs: clear to auscultation bilaterally Abdomen: gravid   Lab Results  Component Value Date   WBC 7.4 12/19/2013   HGB 10.3* 12/19/2013   HCT 31.9* 12/19/2013   MCV 85.5 12/19/2013   PLT 228 12/19/2013   Lab Results  Component Value Date   PREGTESTUR POSITIVE* 04/27/2012     Assessment/Plan Proceed with Ablation  Patient Active Problem List   Diagnosis Date Noted  . Obesity in pregnancy 08/13/2012  . Late prenatal care 08/13/2012  IUP at  term Prior C/s Desires repeat C/S and BTL PLAN/ Proceed with repeat C/S and BTL Whitney Yang 12/19/2013, 11:46 AM

## 2013-12-19 NOTE — Transfer of Care (Signed)
Immediate Anesthesia Transfer of Care Note  Patient: Whitney Yang  Procedure(s) Performed: Procedure(s): CESAREAN SECTION (N/A)  Patient Location: PACU  Anesthesia Type:Spinal  Level of Consciousness: awake, alert  and oriented  Airway & Oxygen Therapy: Patient Spontanous Breathing  Post-op Assessment: Report given to PACU RN and Post -op Vital signs reviewed and stable  Post vital signs: Reviewed and stable  Complications: No apparent anesthesia complications

## 2013-12-19 NOTE — Anesthesia Procedure Notes (Signed)
Spinal  Patient location during procedure: OR Start time: 12/19/2013 12:05 PM End time: 12/19/2013 12:07 PM Staffing Anesthesiologist: Leilani Able Performed by: anesthesiologist  Preanesthetic Checklist Completed: patient identified, site marked, surgical consent, pre-op evaluation, timeout performed, IV checked, risks and benefits discussed and monitors and equipment checked Spinal Block Patient position: sitting Prep: DuraPrep Patient monitoring: heart rate, cardiac monitor, continuous pulse ox and blood pressure Approach: midline Location: L3-4 Injection technique: single-shot Needle Needle type: Pencan  Needle gauge: 24 G Needle length: 9 cm Needle insertion depth: 5 cm Assessment Sensory level: T4

## 2013-12-19 NOTE — Anesthesia Postprocedure Evaluation (Signed)
Anesthesia Post Note  Patient: Whitney Yang  Procedure(s) Performed: Procedure(s) (LRB): CESAREAN SECTION (N/A)  Anesthesia type: Spinal  Patient location: PACU  Post pain: Pain level controlled  Post assessment: Post-op Vital signs reviewed  Last Vitals:  Filed Vitals:   12/19/13 1315  BP: 104/48  Pulse: 76  Temp:   Resp: 21    Post vital signs: Reviewed  Level of consciousness: awake  Complications: No apparent anesthesia complications

## 2013-12-19 NOTE — Progress Notes (Signed)
CSW consult received to assess reason for LPNC @ 27 weeks.  Per hospital policy, LPNC is considered 28 weeks or greater. No social concerns identified.  No barriers to discharge.  

## 2013-12-19 NOTE — Anesthesia Preprocedure Evaluation (Signed)
Anesthesia Evaluation  Patient identified by MRN, date of birth, ID band Patient awake    Reviewed: Allergy & Precautions, H&P , NPO status , Patient's Chart, lab work & pertinent test results  Airway Mallampati: II      Dental   Pulmonary former smoker,  breath sounds clear to auscultation        Cardiovascular Exercise Tolerance: Good Rhythm:regular Rate:Normal     Neuro/Psych    GI/Hepatic   Endo/Other  Morbid obesity  Renal/GU      Musculoskeletal   Abdominal   Peds  Hematology   Anesthesia Other Findings   Reproductive/Obstetrics (+) Pregnancy                           Anesthesia Physical Anesthesia Plan  ASA: III  Anesthesia Plan: Spinal   Post-op Pain Management:    Induction:   Airway Management Planned:   Additional Equipment:   Intra-op Plan:   Post-operative Plan:   Informed Consent: I have reviewed the patients History and Physical, chart, labs and discussed the procedure including the risks, benefits and alternatives for the proposed anesthesia with the patient or authorized representative who has indicated his/her understanding and acceptance.     Plan Discussed with: Anesthesiologist, CRNA and Surgeon  Anesthesia Plan Comments:         Anesthesia Quick Evaluation  

## 2013-12-20 ENCOUNTER — Encounter (HOSPITAL_COMMUNITY): Payer: Self-pay | Admitting: Obstetrics and Gynecology

## 2013-12-20 LAB — CBC
HCT: 29.6 % — ABNORMAL LOW (ref 36.0–46.0)
HEMOGLOBIN: 9.4 g/dL — AB (ref 12.0–15.0)
MCH: 27.4 pg (ref 26.0–34.0)
MCHC: 31.8 g/dL (ref 30.0–36.0)
MCV: 86.3 fL (ref 78.0–100.0)
PLATELETS: 201 10*3/uL (ref 150–400)
RBC: 3.43 MIL/uL — AB (ref 3.87–5.11)
RDW: 14.2 % (ref 11.5–15.5)
WBC: 8.1 10*3/uL (ref 4.0–10.5)

## 2013-12-20 NOTE — Addendum Note (Signed)
Addendum created 12/20/13 0805 by Shanon Payor, CRNA   Modules edited: Notes Section   Notes Section:  File: [id number]

## 2013-12-20 NOTE — Anesthesia Postprocedure Evaluation (Signed)
  Anesthesia Post-op Note  Patient: Whitney Yang  Procedure(s) Performed: Procedure(s): CESAREAN SECTION (N/A)  Patient Location: Mother/Baby  Anesthesia Type:Spinal  Level of Consciousness: awake, alert  and oriented  Airway and Oxygen Therapy: Patient Spontanous Breathing  Post-op Pain: none  Post-op Assessment: Post-op Vital signs reviewed, Patient's Cardiovascular Status Stable, Respiratory Function Stable, No headache, No backache, No residual numbness and No residual motor weakness  Post-op Vital Signs: Reviewed and stable  Last Vitals:  Filed Vitals:   12/20/13 0400  BP: 119/48  Pulse: 84  Temp: 36.8 C  Resp: 16    Complications: No apparent anesthesia complications

## 2013-12-20 NOTE — Op Note (Signed)
NAME:  Whitney Yang, Whitney Yang          ACCOUNT NO.:  [id number]  MEDICAL RECORD NO.:  [id number]  LOCATION:  9102                          FACILITY:  [facility]  PHYSICIAN:  Malva LimesMark Anderson, M.D.    DATE OF BIRTH:  [birth year]  DATE OF PROCEDURE:  12/19/2013 DATE OF DISCHARGE:                              OPERATIVE REPORT   PREOPERATIVE DIAGNOSES: 1. Intrauterine pregnancy at term. 2. History of prior cesarean section. 3. The patient declines attempt at trial of labor after cesarean     section. 4. The patient desires permanent sterilization.  POSTOPERATIVE DIAGNOSES: 1. Intrauterine pregnancy at term. 2. History of prior cesarean section. 3. The patient declines attempt at trial of labor after cesarean     section. 4. The patient desires permanent sterilization.  PROCEDURE: 1. Repeat low-transverse cesarean section. 2. Bilateral tubal ligation with Filshie clips.  SURGEON:  Malva LimesMark Anderson, M.D.  ASSISTANT:  Ilda Moriichard Kaplan, M.D.  ANESTHESIA:  Spinal.  ANTIBIOTICS:  Ancef 2 g.  DRAINS:  Foley bedside drainage.  ESTIMATED BLOOD LOSS:  900 mL.  SPECIMENS:  None.  COMPLICATIONS:  None.  FINDINGS:  The patient had omental adhesions to the anterior abdominal wall.  Fallopian tubes and ovaries appeared to be normal.  She delivered 1 live viable infant in the vertex presentation.  PROCEDURE IN DETAIL:  The  patient was taken to the operative suite, where a spinal anesthetic was administered without difficulty.  She was then placed in dorsal supine position with a left lateral tilt.  She was prepped and draped in the usual fashion for this procedure.  A Pfannenstiel incision was made through the previous scar on entering the abdominal cavity.  The bladder flap was taken down with sharp dissection.  A low-transverse uterine incision was made in the midline using the Metzenbaum scissors and then extended laterally with blunt dissection.  Amniotic fluid was noted to be clear.  The  infant was then delivered in a vertex presentation.  On delivery of the head, the oropharynx and nostrils were then bulb suctioned.  The remaining infant was then delivered.  The cord was doubly clamped and cut and the infant handed to the awaiting NICU team.  Cord blood was then obtained. Placenta was manually removed.  The uterus was exteriorized.  Uterine cavity was wiped with a wet lap.  The uterine incision was closed using a single layer of 0 Monocryl suture in a running locking fashion.  The bladder flap was not repaired.  At this point, the patient confirmed that she did indeed want a tubal ligation.  Filshie clip was then applied to the right fallopian tube in the isthmic portion.  A clip was applied perpendicular to the tube.  The entire tube appeared to be within the clasp.  The clasp appeared to be tightly closed.  A similar procedure was performed on the opposite side.  The uterus was then placed back in the abdominal cavity.  Hemostasis was then checked and felt to be adequate.  The parietal peritoneum and rectus muscles were reapproximated in the midline using 2-0 Monocryl in a running fashion. The fascia was closed using 0 Monocryl suture in a running fashion. Subcuticular tissue was made hemostatic with the  Bovie.  Stainless steel clips were used to close the skin.  The patient tolerated the procedure well.  She was taken to recovery room in stable condition.  Instrument and lap count was correct x3.          ______________________________ Malva Limes, M.D.     MA/MEDQ  D:  12/19/2013  T:  12/20/2013  Job:  [id number]

## 2013-12-20 NOTE — Lactation Note (Addendum)
This note was copied from the chart of Whitney Yang. Lactation Consultation Note; Initial visit with this experienced BF mom. She is sleepy at present. Reports that baby has been nursing well with no pain, Baby asleep in bassinet. No questions at present. BF brochure given with resources for support after DC. To call prn  Patient Name: Whitney Yang Date: 12/20/2013 Reason for consult: Initial assessment   Maternal Data Formula Feeding for Exclusion: No Infant to breast within first hour of birth: Yes Has patient been taught Hand Expression?: Yes Does the patient have breastfeeding experience prior to this delivery?: Yes  Feeding   LATCH Score/Interventions  Lactation Tools Discussed/Used     Consult Status Consult Status: Follow-up Date: 12/21/13 Follow-up type: In-patient    Pamelia Hoit 12/20/2013, 1:49 PM

## 2013-12-20 NOTE — Progress Notes (Signed)
Subjective: Postpartum Day 1: Cesarean Delivery Patient reports pain controlled, no nausea or vomiting, tolerating po. Desires circ but will have performed as an outpatient  Objective: Vital signs in last 24 hours: Temp:  [97.7 F (36.5 C)-98.7 F (37.1 C)] 98.7 F (37.1 C) (05/29 0800) Pulse Rate:  [62-98] 76 (05/29 0800) Resp:  [16-24] 18 (05/29 0800) BP: (95-130)/(48-90) 109/70 mmHg (05/29 0800) SpO2:  [95 %-99 %] 98 % (05/29 0800) Weight:  [103.874 kg (229 lb)] 103.874 kg (229 lb) (05/28 1300)  Physical Exam:  General: alert, cooperative and appears stated age Lochia: appropriate Uterine Fundus: firm Incision: dressing intact DVT Evaluation: No evidence of DVT seen on physical exam.   Recent Labs  12/19/13 1025 12/20/13 0633  HGB 10.3* 9.4*  HCT 31.9* 29.6*    Assessment/Plan: Status post Cesarean section. Doing well postoperatively.  Continue current care.  Freddrick March. Mahasin Riviere 12/20/2013, 9:55 AM

## 2013-12-21 MED ORDER — DOCUSATE SODIUM 100 MG PO CAPS: 100.0000 mg | ORAL_CAPSULE | Freq: Two times a day (BID) | ORAL | Status: DC

## 2013-12-21 MED ORDER — DOCUSATE SODIUM 100 MG PO CAPS: 100.0000 mg | ORAL_CAPSULE | Freq: Two times a day (BID) | ORAL | Status: AC

## 2013-12-21 MED ORDER — OXYCODONE-ACETAMINOPHEN 5-325 MG PO TABS: 2.0000 | ORAL_TABLET | ORAL | Status: AC | PRN

## 2013-12-21 MED ORDER — OXYCODONE-ACETAMINOPHEN 5-325 MG PO TABS: 2.0000 | ORAL_TABLET | ORAL | Status: DC | PRN

## 2013-12-21 NOTE — Lactation Note (Addendum)
This note was copied from the chart of Whitney Yang. Lactation Consultation Note Requested a NS. Has positional stripe to Rt. Nipple. Nipples has short shafts. Stated had to use a NS in the past. Demonstrated how to apply, gave shells, and hand pump. Encouraged to call for assistance and latch verification. Patient Name: Whitney Yang [id number] Date: 12/21/2013     Maternal Data    Feeding Feeding Type: Breast Fed Length of feed: 20 min  [facility] Score/Interventions                      Lactation Tools Discussed/Used     Consult Status      Charyl Dancer 12/21/2013, 1:18 AM

## 2013-12-21 NOTE — Discharge Summary (Signed)
Obstetric Discharge Summary Reason for Admission: cesarean section Prenatal Procedures: NST and ultrasound Intrapartum Procedures: cesarean: low cervical, transverse and tubal ligation Postpartum Procedures: none Complications-Operative and Postpartum: none Hemoglobin  Date Value Ref Range Status  12/20/2013 9.4* 12.0 - 15.0 g/dL Final     HCT  Date Value Ref Range Status  12/20/2013 29.6* 36.0 - 46.0 % Final    Physical Exam:  General: alert, cooperative and appears stated age 23: appropriate Uterine Fundus: firm Incision: healing well DVT Evaluation: No evidence of DVT seen on physical exam.  Discharge Diagnoses: Term Pregnancy-delivered  Discharge Information: Date: 12/21/2013 Activity: pelvic rest Diet: routine Medications: Colace and Percocet Condition: improved Instructions: refer to practice specific booklet Discharge to: home Follow-up Information   Follow up with [name], MD In 4 weeks. (For a postpartum evaluation)    Specialty:  Obstetrics and Gynecology   Contact information:   [address] [place] [address] [phone]       Follow up with Almon Hercules., MD In 3 days. (For staple removal)    Specialty:  Obstetrics and Gynecology   Contact information:   [address] [place] [address] [phone]       Newborn Data: Live born female  Birth Weight: 8 lb 4.5 oz (3755 g) APGAR: 9, 9  Home with mother.  Whitney Yang. Whitney Yang 12/21/2013, 10:00 AM

## 2013-12-21 NOTE — Lactation Note (Signed)
This note was copied from the chart of Whitney Yang. Lactation Consultation Note  Patient Name: Whitney Yang [id number] Date: 12/21/2013 Reason for consult: Follow-up assessmentper mom the baby recently fed and cluster fed for several times. Without the nipple shield. LC reviewed basics , and recommended prevention sore nipples and engorgement and tx. LC recommended prior to latching , breast massage , hand express, pre-pump and reverse pressure exericise , latch with breast compressions Until the baby is in a consistent pattern with swallows. Mom already has comfort gels, and hand pump, per mom plans to breast feed without the nipple shield because the baby is latching so well.  Referred mom to the baby and me booklet. Mother informed of post-discharge support and given phone number to the lactation department, including services for phone call assistance; out-patient appointments; and breastfeeding support group. List of other breastfeeding resources in the community given in the handout. Encouraged mother to call for problems or concerns related to breastfeeding.   Maternal Data Has patient been taught Hand Expression?: Yes  Feeding Feeding Type:  (per mom recently breast fed , cluster without the NS ) Length of feed: 15 min (right per mom , without nipple shield )  LATCH Score/Interventions Latch: Grasps breast easily, tongue down, lips flanged, rhythmical sucking.  Audible Swallowing: A few with stimulation Intervention(s): Skin to skin Intervention(s): Skin to skin  Type of Nipple: Everted at rest and after stimulation  Comfort (Breast/Nipple): Soft / non-tender  Problem noted: Mild/Moderate discomfort Interventions (Mild/moderate discomfort): Comfort gels;Pre-pump if needed  Hold (Positioning): Assistance needed to correctly position infant at breast and maintain latch. Intervention(s): Breastfeeding basics reviewed  LATCH Score: 8  Lactation Tools  Discussed/Used Tools: Pump Breast pump type: Manual   Consult Status Consult Status: Complete Date: 12/21/13    Kathrin Greathouse 12/21/2013, 9:46 AM

## 2013-12-24 NOTE — Progress Notes (Signed)
Post discharge chart review completed.  

## 2014-05-26 ENCOUNTER — Encounter (HOSPITAL_COMMUNITY): Payer: Self-pay | Admitting: Obstetrics and Gynecology

## 2014-06-16 IMAGING — US US OB FOLLOW-UP
2 series · 12 of 28 positions shown · non-contrast
Comparison: none

[Series 1: us ob follow up · 8 of 31 slices shown (1 of 2)]
[im 2/31]
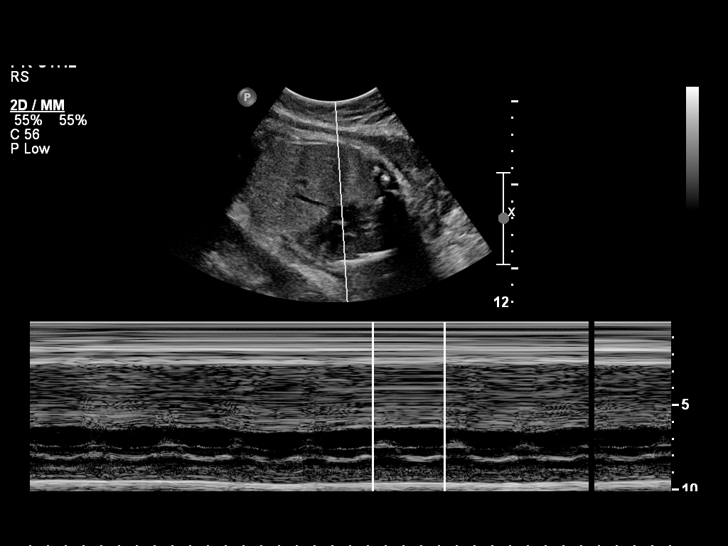
[im 6/31]
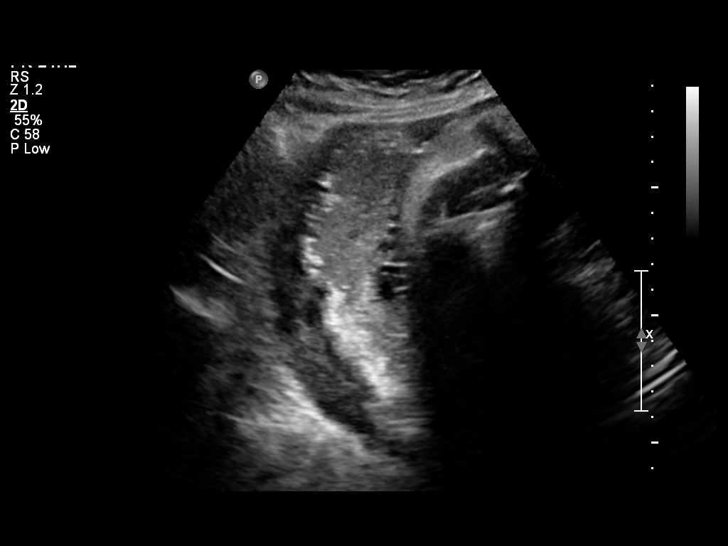
[im 9/31]
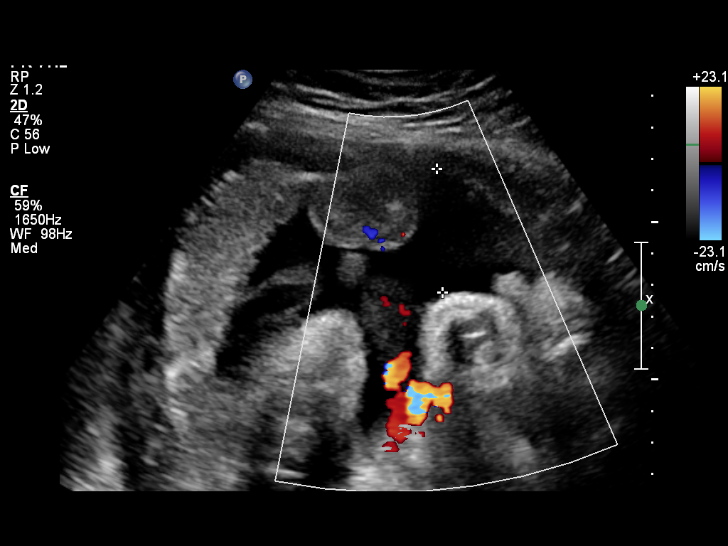
[im 14/31]
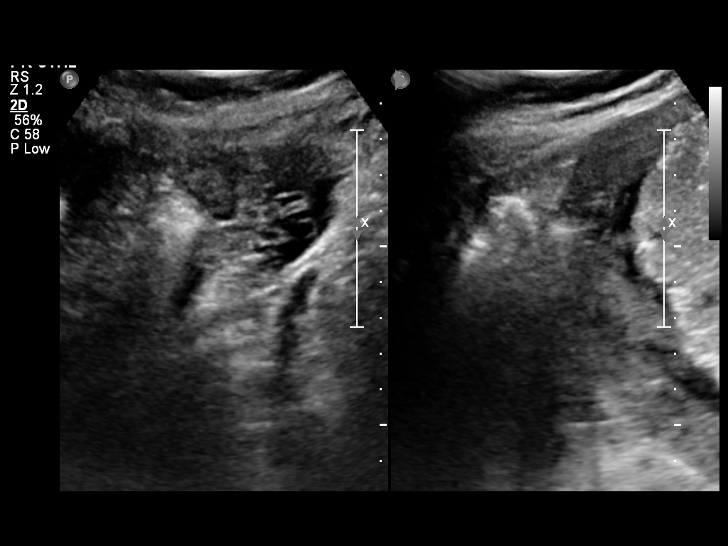
[im 17/31]
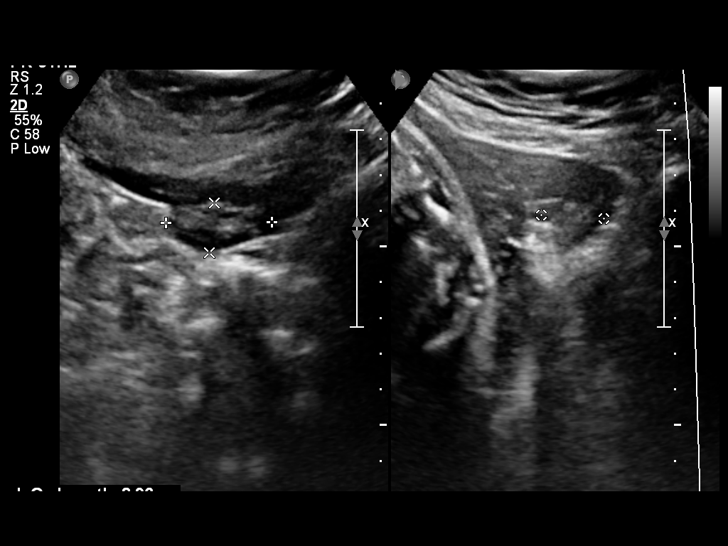
[im 21/31]
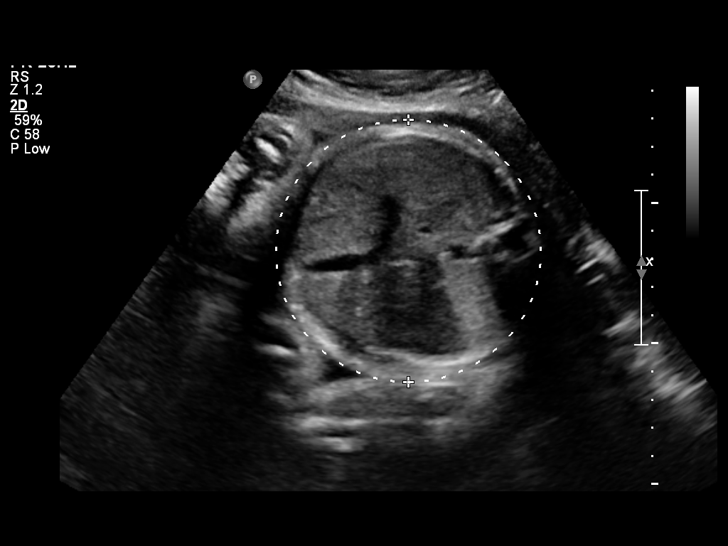
[im 26/31]
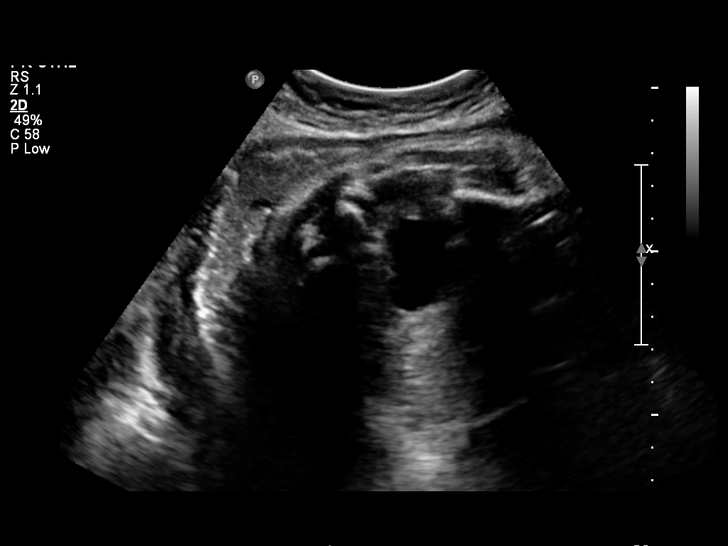
[im 29/31]
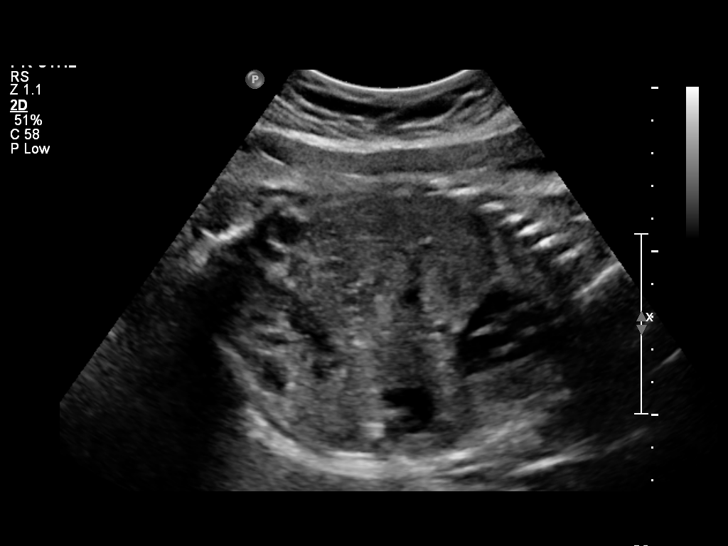

[Series 1: us ob follow up · 4 of 14 slices shown (2 of 2)]
[im 1/14]
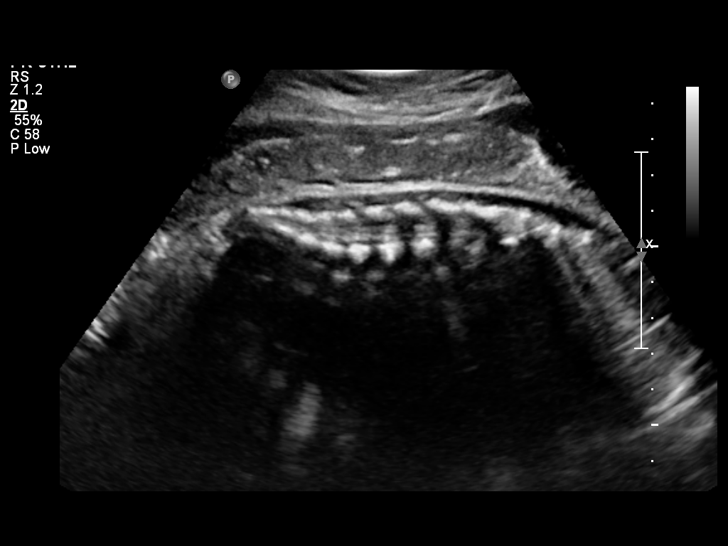
[im 5/14]
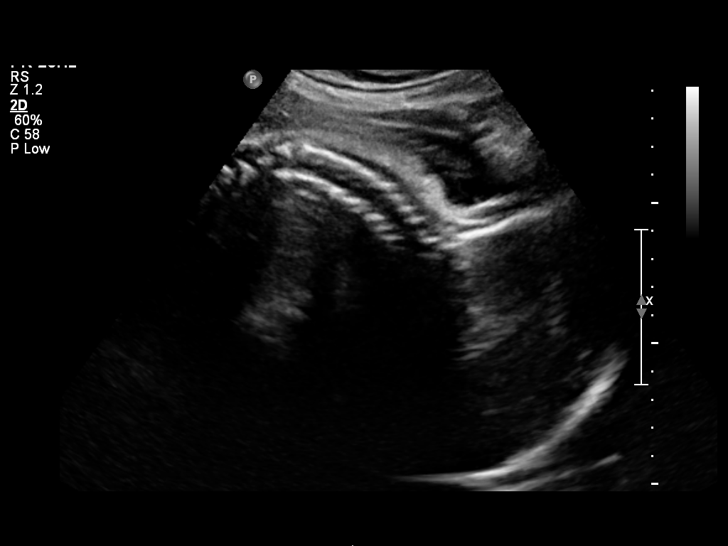
[im 9/14]
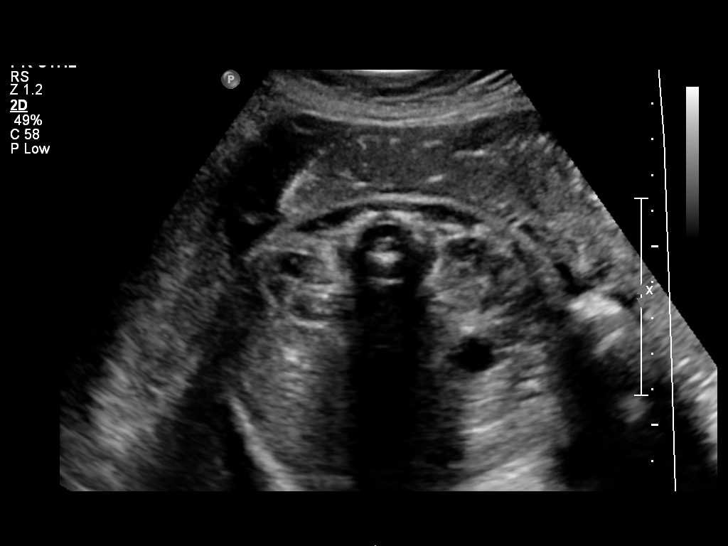
[im 12/14]
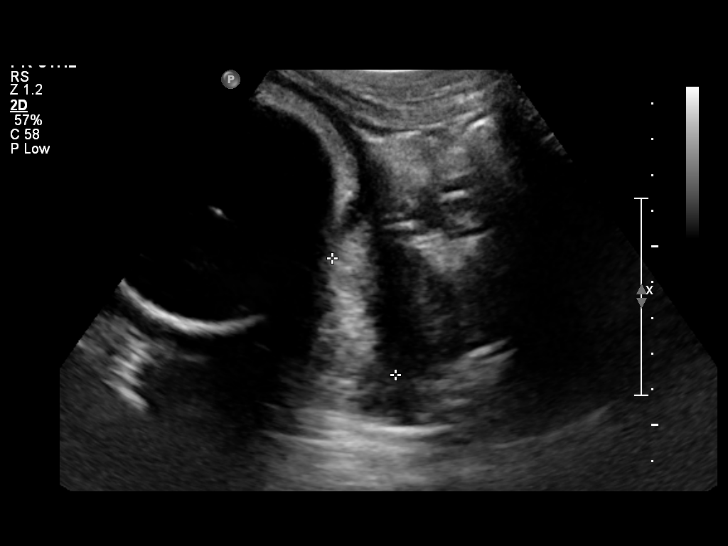

[12 of 28 positions shown; findings below may reference images not displayed]

OBSTETRICS REPORT
                      (Signed Final 10/17/2012 [DATE])

Service(s) Provided

 US OB FOLLOW UP                                       76816.1
Indications

 Assess fetal growth
 Follow-up incomplete fetal anatomic evaluation
Fetal Evaluation

 Num Of Fetuses:    1
 Fetal Heart Rate:  149                         bpm
 Cardiac Activity:  Observed
 Presentation:      Cephalic
 Placenta:          Posterior, above cervical
                    os
 P. Cord            Previously Visualized
 Insertion:

 Amniotic Fluid
 AFI FV:      Subjectively within normal limits
 AFI Sum:     11.78   cm      31   %Tile     Larg Pckt:   4.27   cm
 RUQ:   3.92   cm    RLQ:    4.27   cm    LUQ:   0.77    cm   LLQ:    2.82   cm
Biometry

 BPD:     83.1  mm    G. Age:   33w 3d                CI:        71.16   70 - 86
                                                      FL/HC:      20.5   19.9 -

 HC:     313.8  mm    G. Age:   35w 1d       62  %    HC/AC:      1.06   0.96 -

 AC:     295.7  mm    G. Age:   33w 4d       59  %    FL/BPD:     77.5   71 - 87
 FL:      64.4  mm    G. Age:   33w 2d       38  %    FL/AC:      21.8   20 - 24

 Est. FW:    0042  gm    4 lb 15 oz      65  %
Gestational Age

 LMP:           37w 1d       Date:   01/31/12                 EDD:   11/06/12
 U/S Today:     33w 6d                                        EDD:   11/29/12
 Best:          33w 2d    Det. By:   U/S (08/13/12)           EDD:   12/03/12
Anatomy

 Cranium:          Appears normal         Aortic Arch:      Previously seen
 Fetal Cavum:      Previously seen        Ductal Arch:      Previously seen
 Ventricles:       Appears normal         Diaphragm:        Appears normal
 Choroid Plexus:   Previously seen        Stomach:          Appears normal
 Cerebellum:       Previously seen        Abdomen:          Appears normal
 Posterior Fossa:  Previously seen        Abdominal Wall:   Previously seen
 Nuchal Fold:      Not applicable (>20    Cord Vessels:     Previously seen
                   wks GA)
 Face:             Orbits and profile     Kidneys:          Appear normal
                   previously seen
 Lips:             Previously seen        Bladder:          Appears normal
 Heart:            Appears normal         Spine:            Limited views
                   (4CH, axis, and                          appear normal
                   situs)
 RVOT:             Previously seen        Lower             Previously seen
                                          Extremities:
 LVOT:             Previously seen        Upper             Previously seen
                                          Extremities:

 Other:  Male gender previously seen. Heels and 5th digit previously seen.
Targeted Anatomy

 Fetal Central Nervous System
 Lat. Ventricles:
Cervix Uterus Adnexa

 Cervical Length:   3.7       cm

 Cervix:       Normal appearance by transabdominal scan.

 Left Ovary:   Size(cm) L: 2.96 x W: 1.75 x H: 1.4  Volume(cc):
 Right Ovary:  Size(cm) L: 2.39 x W: 1.51 x H: 1.46  Volume(cc):
 Adnexa:     No abnormality visualized.
Impression

 Single living IUP with assigned GA of 33w 2d. Appropriate
 fetal growth since the prior ultrasound. EFW is at the 65th
 percentile for assigned GA.
 Normal amniotic fluid  volume and cervical length.
 Visualized fetal anatomy appears normal.
